# Patient Record
Sex: Male | Born: 1960
Health system: Southern US, Community
[De-identification: ages and names within clinical notes are randomized; demographics above are authoritative.]

## PROBLEM LIST (undated history)

## (undated) DIAGNOSIS — E7800 Pure hypercholesterolemia, unspecified: Secondary | ICD-10-CM

## (undated) DIAGNOSIS — I10 Essential (primary) hypertension: Secondary | ICD-10-CM

## (undated) DIAGNOSIS — E119 Type 2 diabetes mellitus without complications: Secondary | ICD-10-CM

## (undated) DIAGNOSIS — I499 Cardiac arrhythmia, unspecified: Secondary | ICD-10-CM

## (undated) DIAGNOSIS — K5792 Diverticulitis of intestine, part unspecified, without perforation or abscess without bleeding: Secondary | ICD-10-CM

## (undated) HISTORY — PX: NASAL SEPTUM SURGERY: SHX37

## (undated) HISTORY — PX: TONSILLECTOMY: SUR1361

## (undated) HISTORY — PX: ABDOMINAL HERNIA REPAIR: SHX539

## (undated) HISTORY — PX: HERNIA REPAIR: SHX51

---

## 1982-02-09 HISTORY — PX: TOE AMPUTATION: SHX809

## 1985-02-09 HISTORY — PX: TENDON RECONSTRUCTION: SHX2487

## 2010-02-09 HISTORY — PX: HEEL SPUR SURGERY: SHX665

## 2011-02-10 HISTORY — PX: VASECTOMY: SHX75

## 2015-10-29 DIAGNOSIS — Z23 Encounter for immunization: Secondary | ICD-10-CM | POA: Diagnosis not present

## 2015-12-03 ENCOUNTER — Other Ambulatory Visit: Payer: Self-pay | Admitting: Family Medicine

## 2015-12-03 ENCOUNTER — Ambulatory Visit
Admission: RE | Admit: 2015-12-03 | Discharge: 2015-12-03 | Disposition: A | Discharge status: Home / Self Care | Payer: BLUE CROSS/BLUE SHIELD | Source: Ambulatory Visit | Attending: Family Medicine | Admitting: Family Medicine

## 2015-12-03 DIAGNOSIS — R059 Cough, unspecified: Secondary | ICD-10-CM

## 2015-12-03 DIAGNOSIS — E785 Hyperlipidemia, unspecified: Secondary | ICD-10-CM | POA: Diagnosis not present

## 2015-12-03 DIAGNOSIS — Z125 Encounter for screening for malignant neoplasm of prostate: Secondary | ICD-10-CM | POA: Diagnosis not present

## 2015-12-03 DIAGNOSIS — R05 Cough: Secondary | ICD-10-CM | POA: Diagnosis not present

## 2015-12-03 DIAGNOSIS — Z0000 Encounter for general adult medical examination without abnormal findings: Secondary | ICD-10-CM | POA: Diagnosis not present

## 2015-12-03 DIAGNOSIS — E119 Type 2 diabetes mellitus without complications: Secondary | ICD-10-CM | POA: Diagnosis not present

## 2015-12-03 IMAGING — CR DG CHEST 2V
2 series · 2 of 2 positions shown · non-contrast
Comparison: None.

CLINICAL DATA: Chronic cough.

EXAM:
CHEST  2 VIEW

[w chest pa]
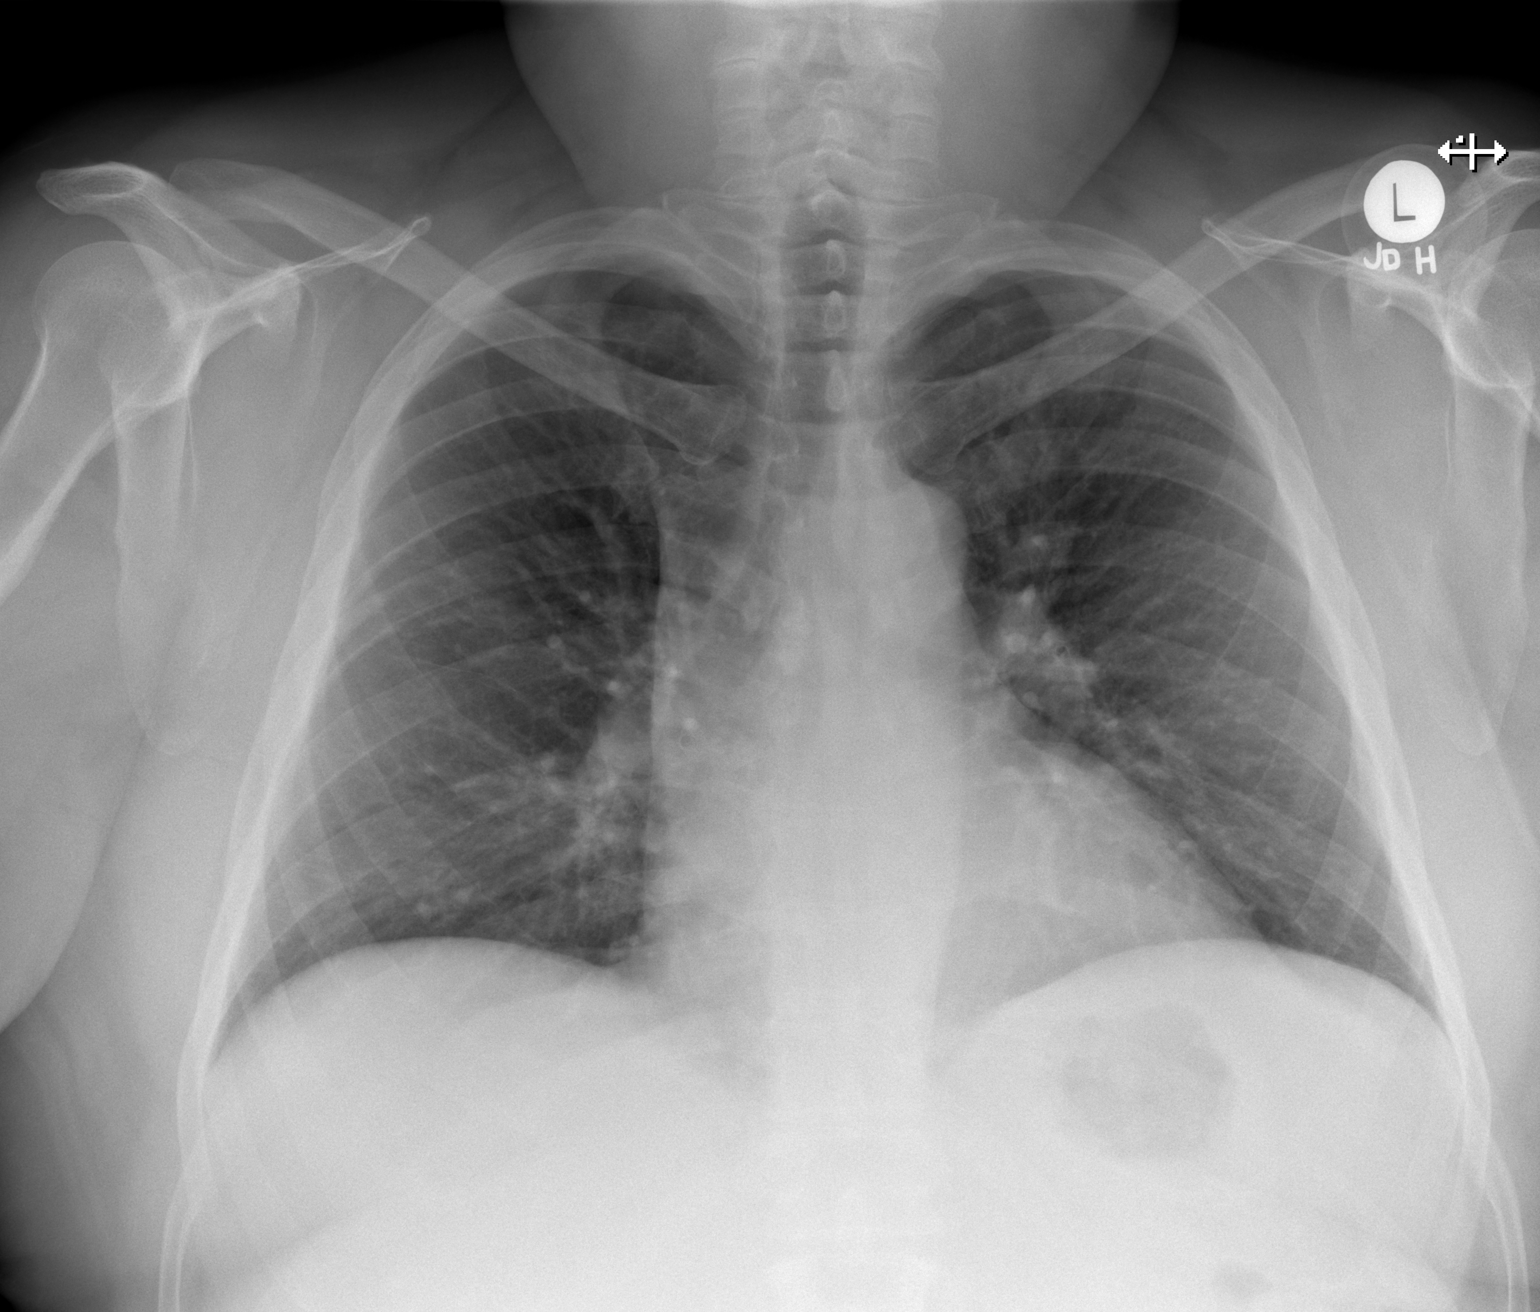

[w chest lat]
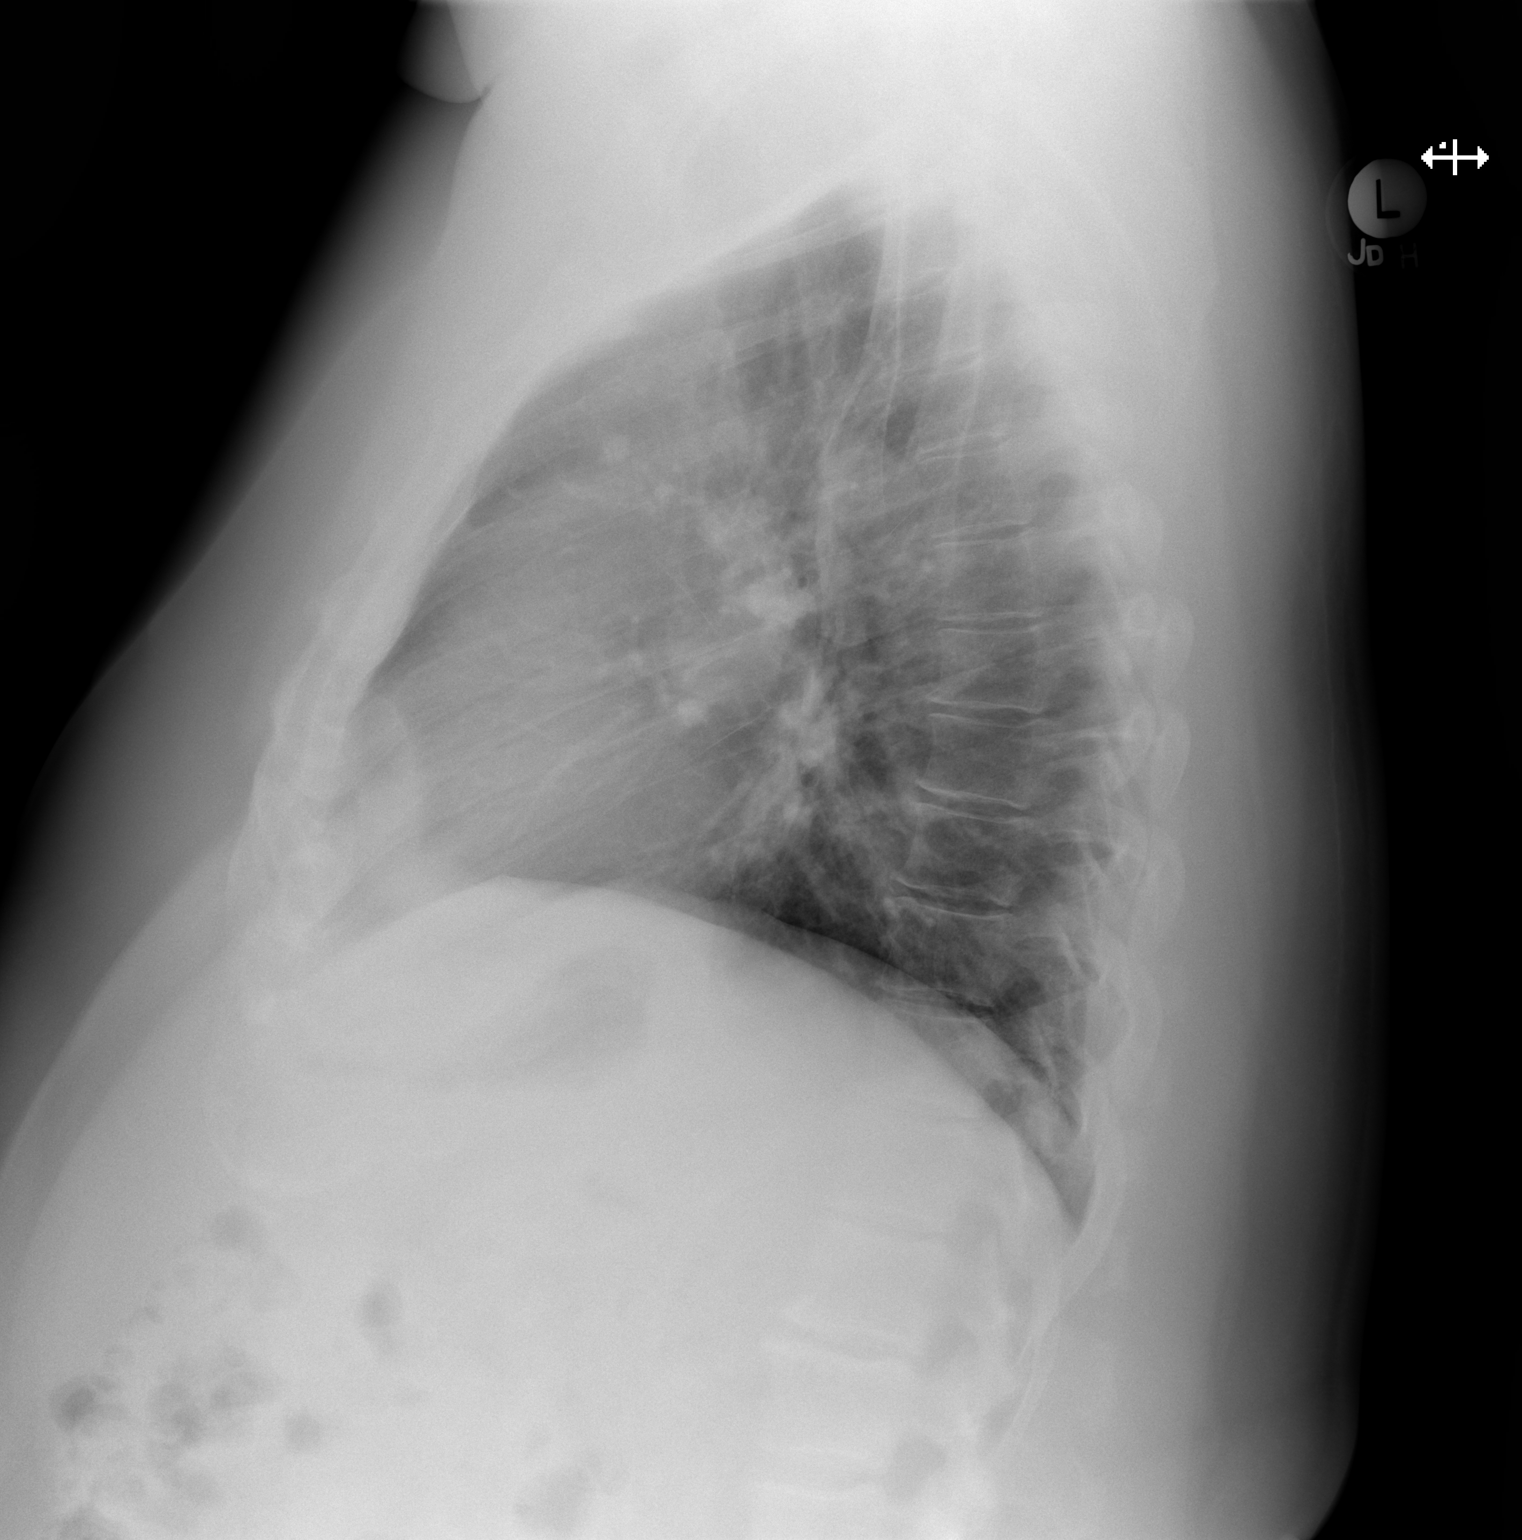

[2 of 2 positions shown; findings below may reference images not displayed]

FINDINGS: The heart size and mediastinal contours are within normal limits.
Both lungs are clear. No pneumothorax or pleural effusion is noted.
The visualized skeletal structures are unremarkable.
IMPRESSION: No active cardiopulmonary disease.

## 2016-07-27 DIAGNOSIS — E119 Type 2 diabetes mellitus without complications: Secondary | ICD-10-CM | POA: Diagnosis not present

## 2016-07-27 DIAGNOSIS — E785 Hyperlipidemia, unspecified: Secondary | ICD-10-CM | POA: Diagnosis not present

## 2016-07-27 DIAGNOSIS — I10 Essential (primary) hypertension: Secondary | ICD-10-CM | POA: Diagnosis not present

## 2016-07-27 DIAGNOSIS — L918 Other hypertrophic disorders of the skin: Secondary | ICD-10-CM | POA: Diagnosis not present

## 2016-11-09 DIAGNOSIS — R52 Pain, unspecified: Secondary | ICD-10-CM | POA: Diagnosis not present

## 2016-11-09 DIAGNOSIS — M94261 Chondromalacia, right knee: Secondary | ICD-10-CM | POA: Diagnosis not present

## 2016-11-13 DIAGNOSIS — R1032 Left lower quadrant pain: Secondary | ICD-10-CM | POA: Diagnosis not present

## 2016-11-24 ENCOUNTER — Other Ambulatory Visit: Payer: Self-pay | Admitting: Family Medicine

## 2016-11-24 ENCOUNTER — Inpatient Hospital Stay (HOSPITAL_COMMUNITY)
Admission: EM | Admit: 2016-11-24 | Discharge: 2016-11-27 | DRG: 392 | Disposition: A | Discharge status: Home / Self Care | Payer: BLUE CROSS/BLUE SHIELD | Attending: Internal Medicine | Admitting: Internal Medicine

## 2016-11-24 ENCOUNTER — Encounter (HOSPITAL_COMMUNITY): Payer: Self-pay | Admitting: *Deleted

## 2016-11-24 ENCOUNTER — Ambulatory Visit
Admission: RE | Admit: 2016-11-24 | Discharge: 2016-11-24 | Disposition: A | Discharge status: Home / Self Care | Payer: BLUE CROSS/BLUE SHIELD | Source: Ambulatory Visit | Attending: Family Medicine | Admitting: Family Medicine

## 2016-11-24 ENCOUNTER — Inpatient Hospital Stay (HOSPITAL_COMMUNITY): Payer: BLUE CROSS/BLUE SHIELD

## 2016-11-24 DIAGNOSIS — Z888 Allergy status to other drugs, medicaments and biological substances status: Secondary | ICD-10-CM

## 2016-11-24 DIAGNOSIS — Z7984 Long term (current) use of oral hypoglycemic drugs: Secondary | ICD-10-CM | POA: Diagnosis not present

## 2016-11-24 DIAGNOSIS — Z87891 Personal history of nicotine dependence: Secondary | ICD-10-CM

## 2016-11-24 DIAGNOSIS — E669 Obesity, unspecified: Secondary | ICD-10-CM | POA: Diagnosis not present

## 2016-11-24 DIAGNOSIS — Z6837 Body mass index (BMI) 37.0-37.9, adult: Secondary | ICD-10-CM | POA: Diagnosis not present

## 2016-11-24 DIAGNOSIS — Z86010 Personal history of colonic polyps: Secondary | ICD-10-CM

## 2016-11-24 DIAGNOSIS — Z7982 Long term (current) use of aspirin: Secondary | ICD-10-CM | POA: Diagnosis not present

## 2016-11-24 DIAGNOSIS — E118 Type 2 diabetes mellitus with unspecified complications: Secondary | ICD-10-CM | POA: Diagnosis present

## 2016-11-24 DIAGNOSIS — I10 Essential (primary) hypertension: Secondary | ICD-10-CM | POA: Diagnosis not present

## 2016-11-24 DIAGNOSIS — Z8249 Family history of ischemic heart disease and other diseases of the circulatory system: Secondary | ICD-10-CM | POA: Diagnosis not present

## 2016-11-24 DIAGNOSIS — K5732 Diverticulitis of large intestine without perforation or abscess without bleeding: Secondary | ICD-10-CM | POA: Diagnosis not present

## 2016-11-24 DIAGNOSIS — D72829 Elevated white blood cell count, unspecified: Secondary | ICD-10-CM | POA: Diagnosis not present

## 2016-11-24 DIAGNOSIS — E785 Hyperlipidemia, unspecified: Secondary | ICD-10-CM

## 2016-11-24 DIAGNOSIS — K5720 Diverticulitis of large intestine with perforation and abscess without bleeding: Secondary | ICD-10-CM

## 2016-11-24 DIAGNOSIS — K5792 Diverticulitis of intestine, part unspecified, without perforation or abscess without bleeding: Secondary | ICD-10-CM

## 2016-11-24 DIAGNOSIS — R1032 Left lower quadrant pain: Secondary | ICD-10-CM

## 2016-11-24 DIAGNOSIS — K5780 Diverticulitis of intestine, part unspecified, with perforation and abscess without bleeding: Secondary | ICD-10-CM | POA: Diagnosis not present

## 2016-11-24 DIAGNOSIS — K630 Abscess of intestine: Secondary | ICD-10-CM | POA: Diagnosis not present

## 2016-11-24 DIAGNOSIS — Z79899 Other long term (current) drug therapy: Secondary | ICD-10-CM | POA: Diagnosis not present

## 2016-11-24 DIAGNOSIS — R109 Unspecified abdominal pain: Secondary | ICD-10-CM | POA: Diagnosis not present

## 2016-11-24 HISTORY — DX: Cardiac arrhythmia, unspecified: I49.9

## 2016-11-24 HISTORY — PX: CT GUIDE ABSCESS DRAINAGE  (ARMC HX): HXRAD1395

## 2016-11-24 HISTORY — DX: Diverticulitis of intestine, part unspecified, without perforation or abscess without bleeding: K57.92

## 2016-11-24 HISTORY — DX: Type 2 diabetes mellitus without complications: E11.9

## 2016-11-24 HISTORY — DX: Pure hypercholesterolemia, unspecified: E78.00

## 2016-11-24 HISTORY — DX: Essential (primary) hypertension: I10

## 2016-11-24 LAB — COMPREHENSIVE METABOLIC PANEL
ALT: 32 U/L (ref 17–63)
ANION GAP: 14 (ref 5–15)
AST: 26 U/L (ref 15–41)
Albumin: 3.5 g/dL (ref 3.5–5.0)
Alkaline Phosphatase: 95 U/L (ref 38–126)
BUN: 8 mg/dL (ref 6–20)
CHLORIDE: 97 mmol/L — AB (ref 101–111)
CO2: 20 mmol/L — AB (ref 22–32)
CREATININE: 0.9 mg/dL (ref 0.61–1.24)
Calcium: 8.9 mg/dL (ref 8.9–10.3)
Glucose, Bld: 134 mg/dL — ABNORMAL HIGH (ref 65–99)
Potassium: 4.4 mmol/L (ref 3.5–5.1)
SODIUM: 131 mmol/L — AB (ref 135–145)
Total Bilirubin: 0.5 mg/dL (ref 0.3–1.2)
Total Protein: 7.8 g/dL (ref 6.5–8.1)

## 2016-11-24 LAB — URINALYSIS, ROUTINE W REFLEX MICROSCOPIC
BILIRUBIN URINE: NEGATIVE
Bacteria, UA: NONE SEEN
GLUCOSE, UA: NEGATIVE mg/dL
Ketones, ur: 20 mg/dL — AB
Leukocytes, UA: NEGATIVE
NITRITE: NEGATIVE
PROTEIN: NEGATIVE mg/dL
SPECIFIC GRAVITY, URINE: 1.044 — AB (ref 1.005–1.030)
Squamous Epithelial / LPF: NONE SEEN
pH: 6 (ref 5.0–8.0)

## 2016-11-24 LAB — CBC
HEMATOCRIT: 47.2 % (ref 39.0–52.0)
Hemoglobin: 15.4 g/dL (ref 13.0–17.0)
MCH: 29.4 pg (ref 26.0–34.0)
MCHC: 32.6 g/dL (ref 30.0–36.0)
MCV: 90.1 fL (ref 78.0–100.0)
PLATELETS: 391 10*3/uL (ref 150–400)
RBC: 5.24 MIL/uL (ref 4.22–5.81)
RDW: 13.3 % (ref 11.5–15.5)
WBC: 17.1 10*3/uL — ABNORMAL HIGH (ref 4.0–10.5)

## 2016-11-24 LAB — PROTIME-INR
INR: 1.06
Prothrombin Time: 13.7 seconds (ref 11.4–15.2)

## 2016-11-24 LAB — I-STAT CG4 LACTIC ACID, ED: LACTIC ACID, VENOUS: 1.39 mmol/L (ref 0.5–1.9)

## 2016-11-24 IMAGING — CT CT IMAGE GUIDED DRAINAGE BY PERCUTANEOUS CATHETER
1 of 2 series · 15 of 32 positions shown, 19 images · non-contrast
Comparison: CT of the abdomen and pelvis performed earlier today.

CLINICAL DATA: Large left lower quadrant sigmoid diverticular
peritoneal abscess requiring drainage.

EXAM:
CT GUIDED CATHETER DRAINAGE OF PERITONEAL ABSCESS

[Series 2: i-spiral 5.0 b40f · axial · 0.93mm/px · z∈[+783,+972]mm · 15 of 62 slices shown, 19 images]
[im 5/62  soft-tissue]
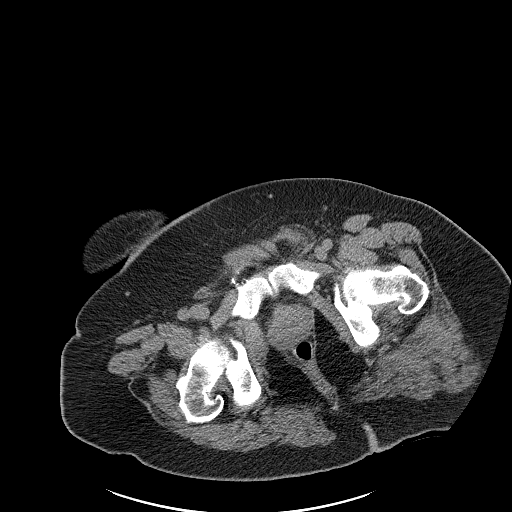
[im 5/62  bone]
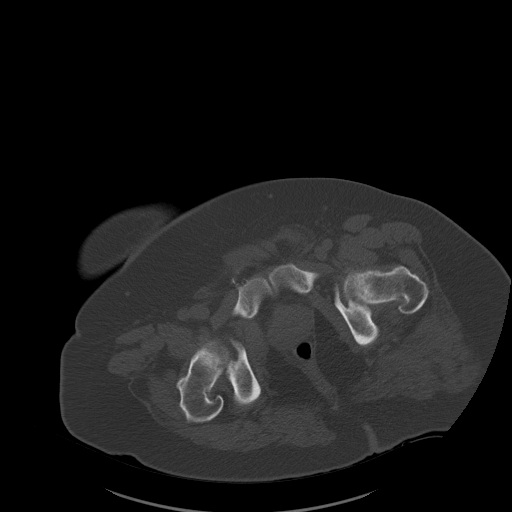
[im 10/62  soft-tissue]
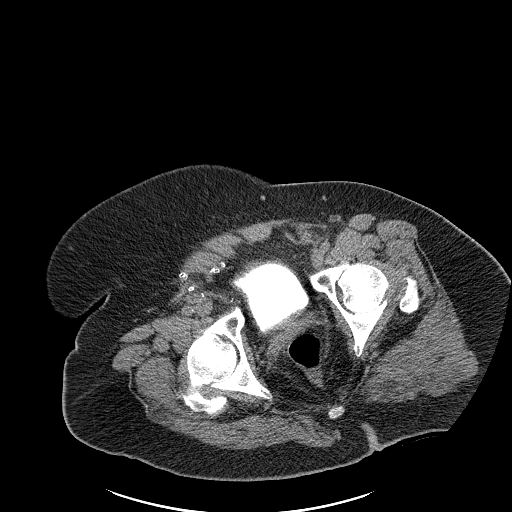
[im 14/62  soft-tissue]
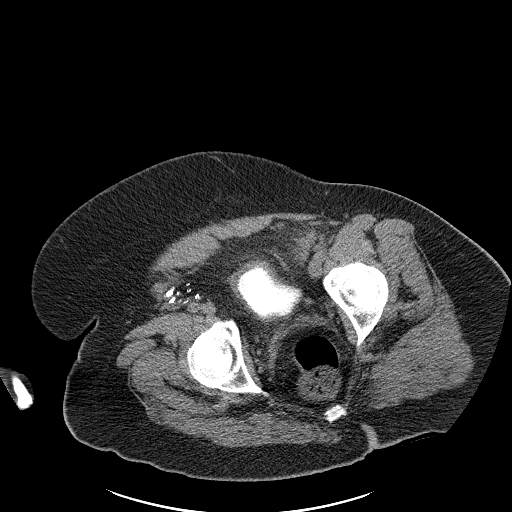
[im 19/62  soft-tissue]
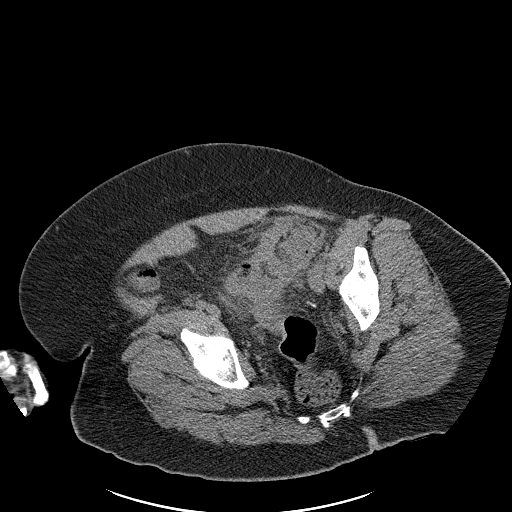
[im 23/62  soft-tissue]
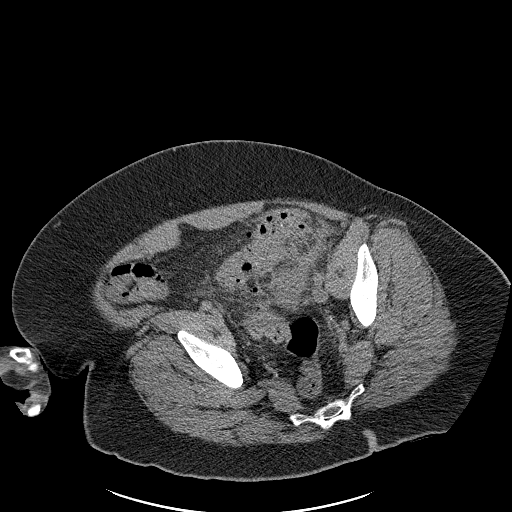
[im 28/62  soft-tissue]
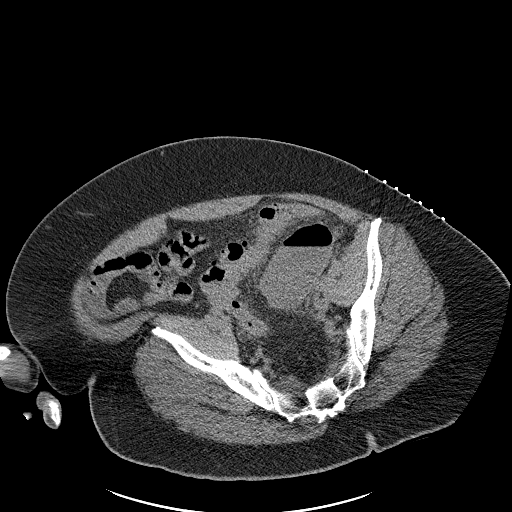
[im 32/62  soft-tissue]
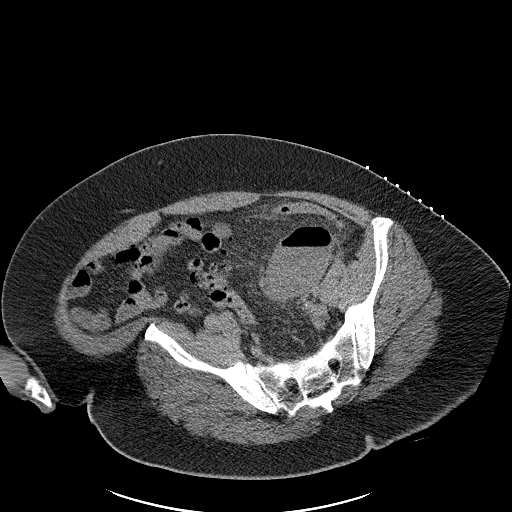
[im 37/62  soft-tissue]
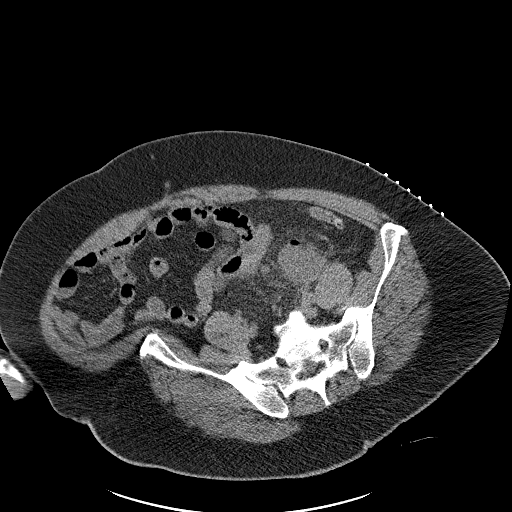
[im 41/62  soft-tissue]
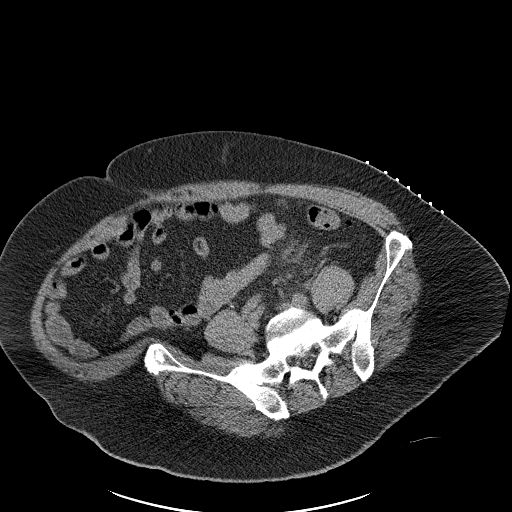
[im 41/62  bone]
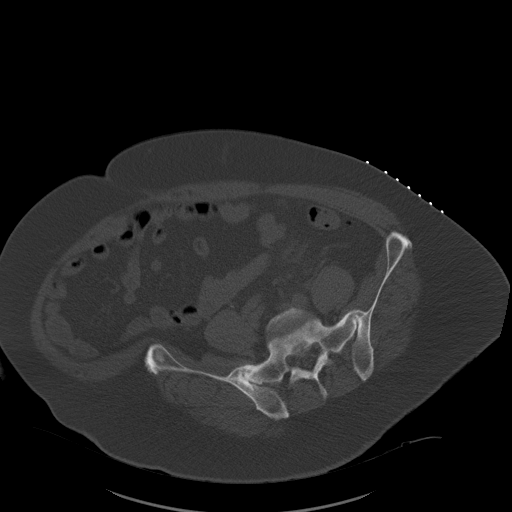
[im 46/62  soft-tissue]
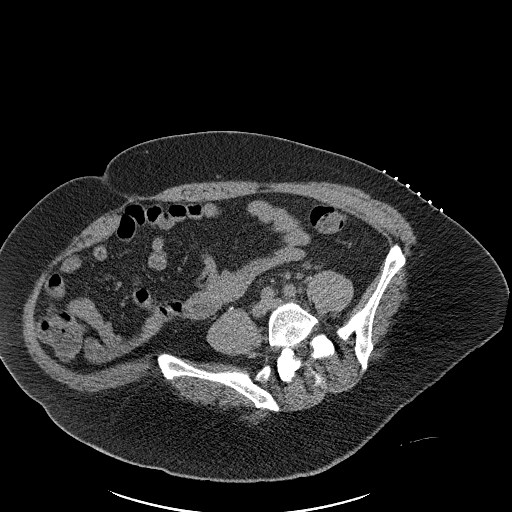
[im 50/62  soft-tissue]
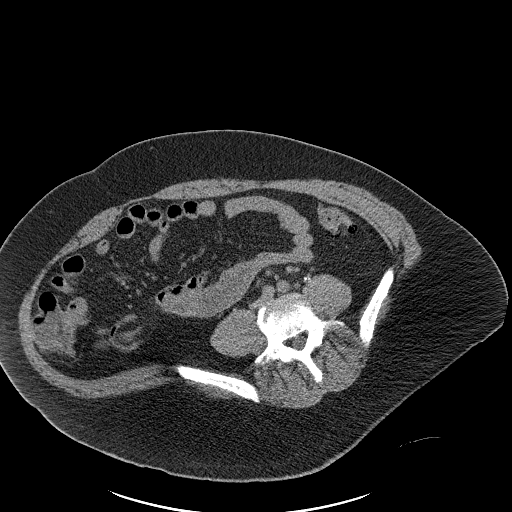
[im 52/62  lung]
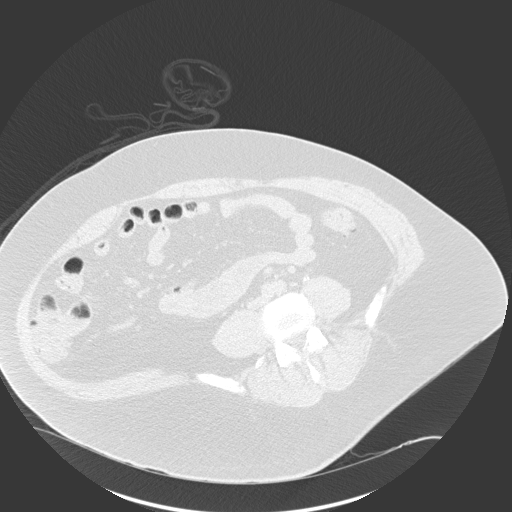
[im 55/62  soft-tissue]
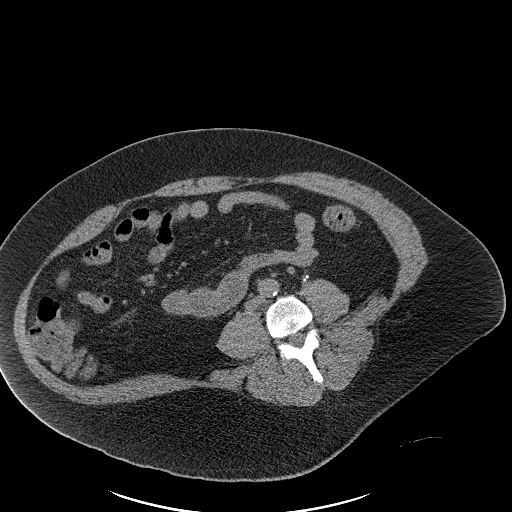
[im 55/62  lung]
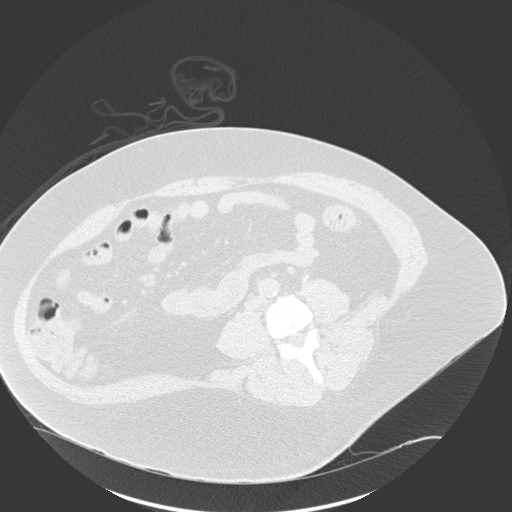
[im 57/62  lung]
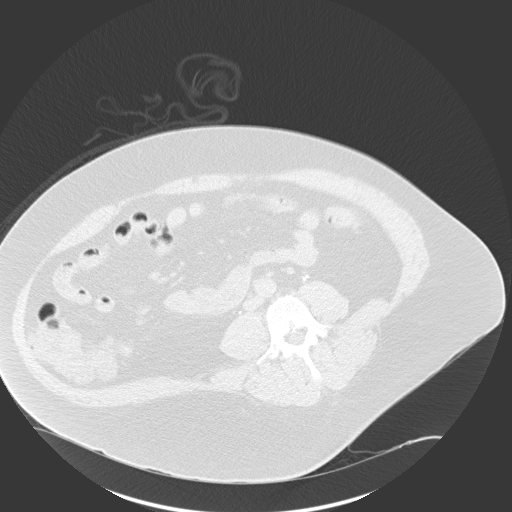
[im 59/62  soft-tissue]
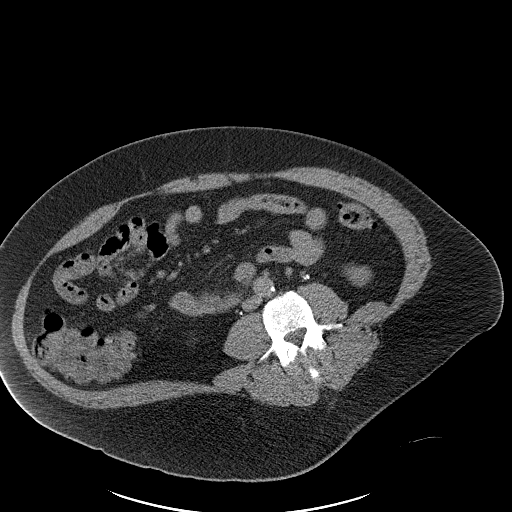
[im 59/62  lung]
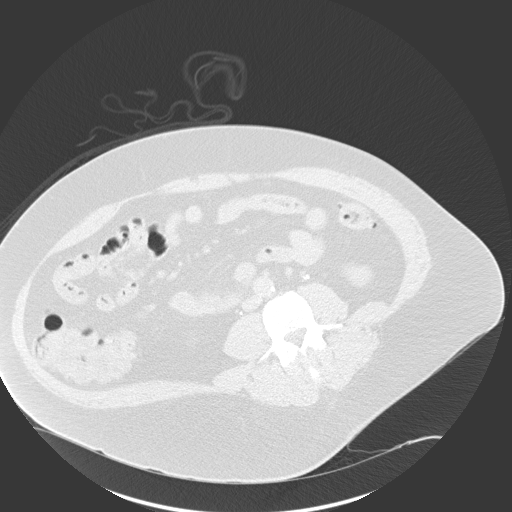

[15 of 32 positions shown; findings below may reference images not displayed]

ANESTHESIA/SEDATION:
2.0 mg IV Versed 100 mcg IV Fentanyl

Total Moderate Sedation Time:  18 minutes

The patient's level of consciousness and physiologic status were
continuously monitored during the procedure by Radiology nursing.

PROCEDURE:
The procedure, risks, benefits, and alternatives were explained to
the patient. Questions regarding the procedure were encouraged and
answered. The patient understands and consents to the procedure. A
time out was performed prior to initiating the procedure.

The left lower abdominal wall was prepped with chlorhexidine in a
sterile fashion, and a sterile drape was applied covering the
operative field. A sterile gown and sterile gloves were used for the
procedure. Local anesthesia was provided with 1% Lidocaine.

CT was performed in a supine position with the left side rolled up
slightly. Under CT guidance, an 18 gauge trocar needle was advanced
into a left lower quadrant diverticular abscess. A fluid sample was
aspirated and sent for culture analysis. A guidewire was advanced
into the collection. The percutaneous tract was dilated and a 12
French percutaneous drainage catheter advanced over the wire.
Catheter positioning was confirmed by CT. The catheter was flushed
and connected to a suction bulb. It was secured at the skin with a
Prolene retention suture and StatLock device.

COMPLICATIONS:
None
FINDINGS: Large left lower quadrant diverticular abscess is identified
superior and posterior to the sigmoid colon. Aspiration yielded
grossly purulent greenish white liquid. A 12 French drain was placed
and is draining well after placement.
IMPRESSION: CT-guided percutaneous catheter drainage of sigmoid diverticular
abscess. A 12 French drainage catheter was placed and attached to
suction bulb drainage. Drain output will be followed. A fluid sample
was sent for culture analysis.

## 2016-11-24 IMAGING — CT CT ABD-PELV W/ CM
1 of 3 series · 13 of 32 positions shown, 18 images · IV contrast (APPLIED)
Comparison: None.

ADDENDUM:
Salient findings discussed by telephone with Dr. YEDOTI TIGEST on
11/24/2016 at 7174 hours.
CLINICAL DATA: 56-year-old male with persistent left lower quadrant
abdominal pain, low-grade fever, nausea vomiting. Diagnosed with
diverticulitis and started on antibiotics 11/13/2016, but symptoms
persist.

EXAM:
CT ABDOMEN AND PELVIS WITH CONTRAST
TECHNIQUE: Multidetector CT imaging of the abdomen and pelvis was performed
using the standard protocol following bolus administration of
intravenous contrast.
CONTRAST:  125mL ZAWER5-TTT IOPAMIDOL (ZAWER5-TTT) INJECTION 61%

[Series 2: abd/pelvis w/cm · axial · 0.98mm/px · z∈[-468,-8]mm · 13 of 104 slices shown, 18 images]
[im 6/104  soft-tissue]
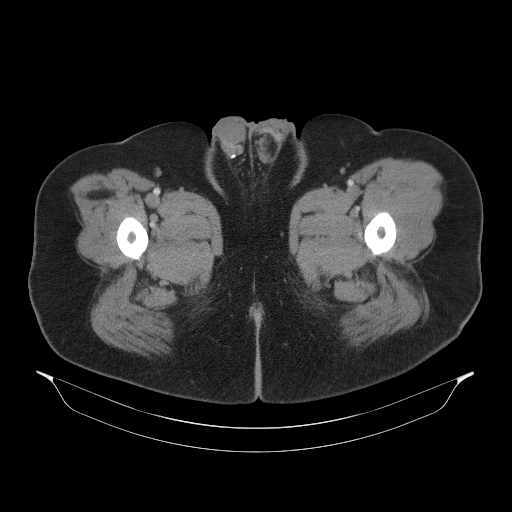
[im 6/104  bone]
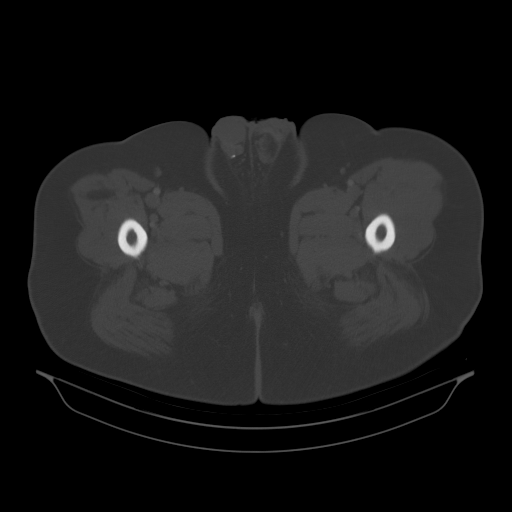
[im 18/104  soft-tissue]
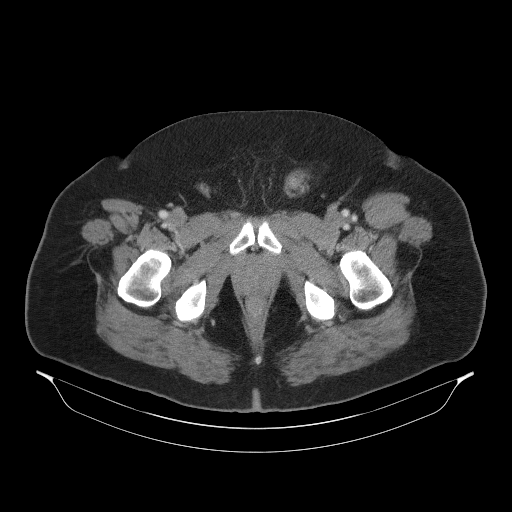
[im 23/104  soft-tissue]
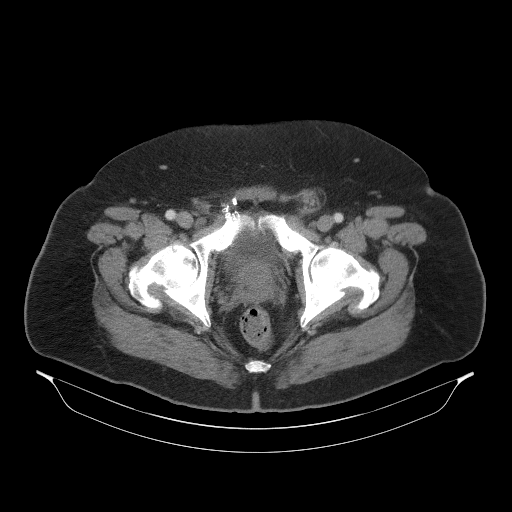
[im 29/104  soft-tissue]
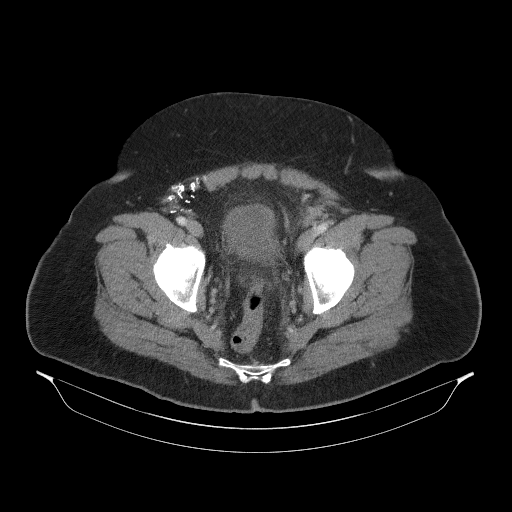
[im 41/104  soft-tissue]
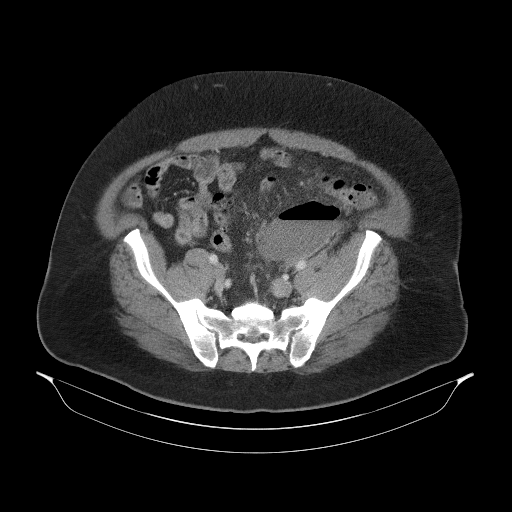
[im 46/104  soft-tissue]
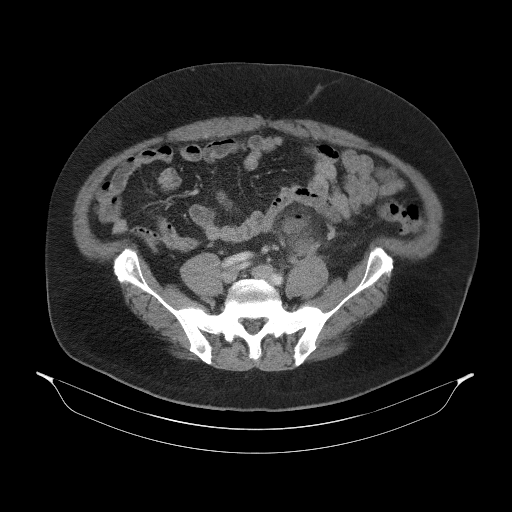
[im 58/104  soft-tissue]
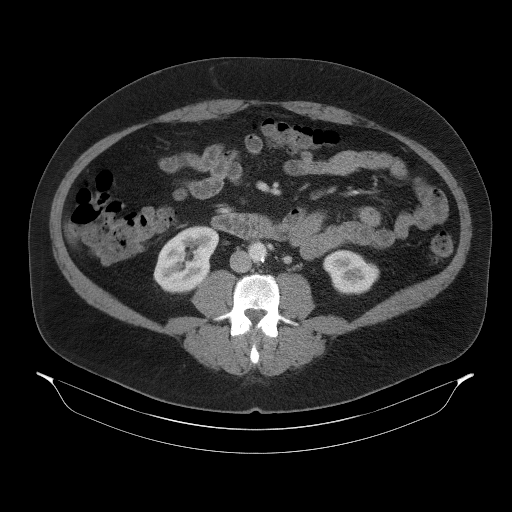
[im 63/104  soft-tissue]
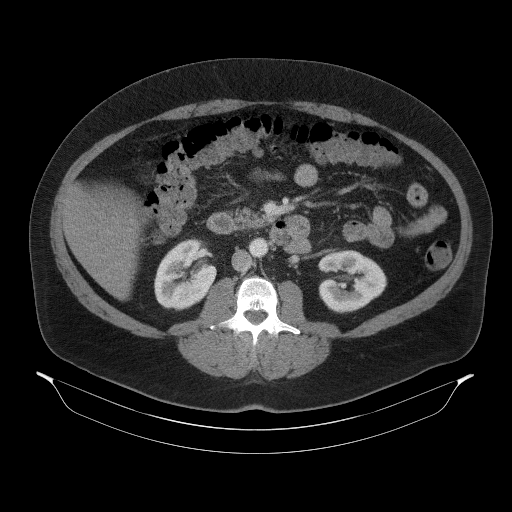
[im 75/104  soft-tissue]
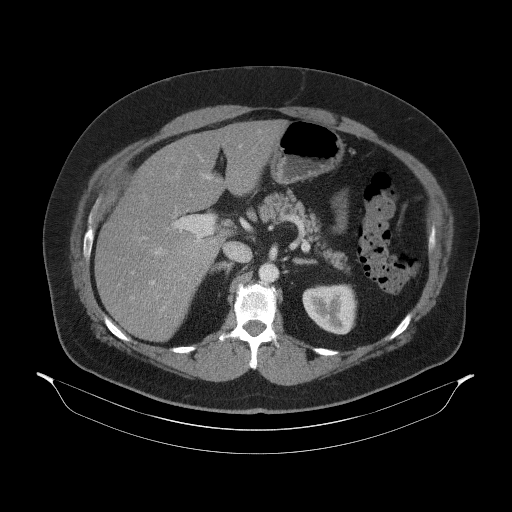
[im 75/104  bone]
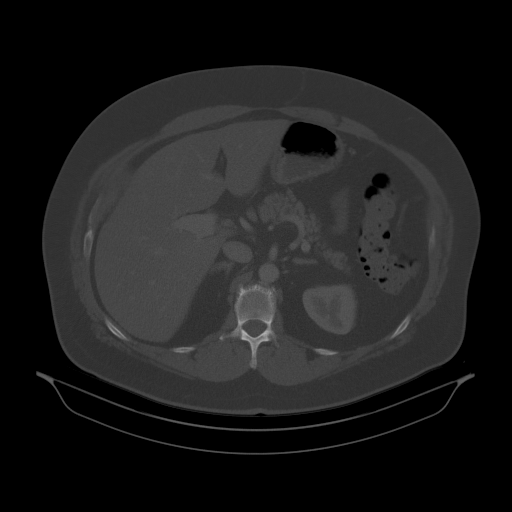
[im 81/104  soft-tissue]
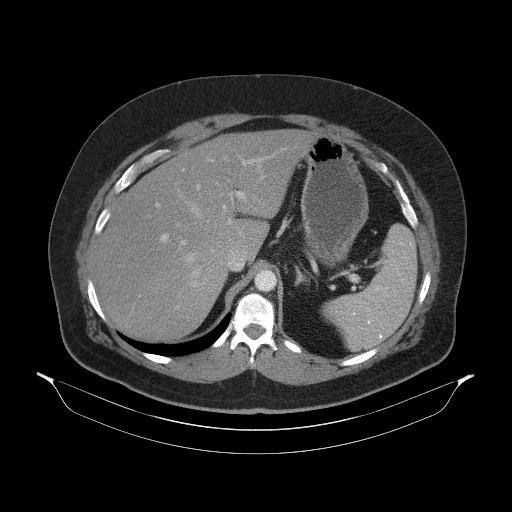
[im 81/104  lung]
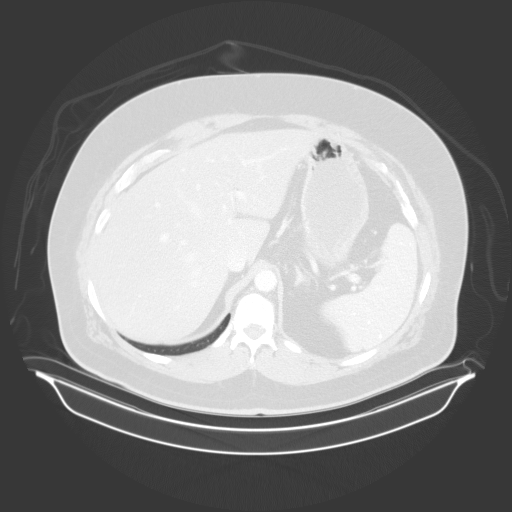
[im 86/104  soft-tissue]
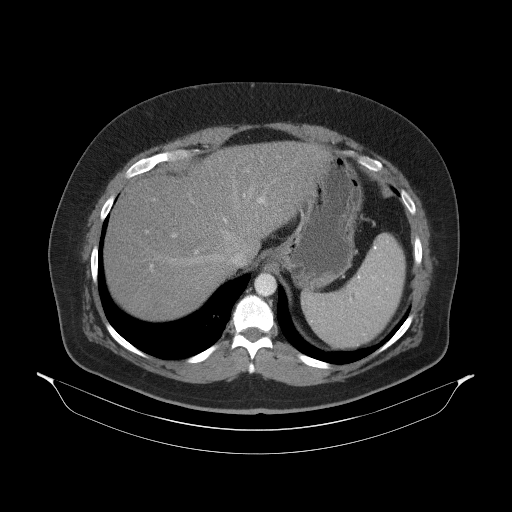
[im 86/104  lung]
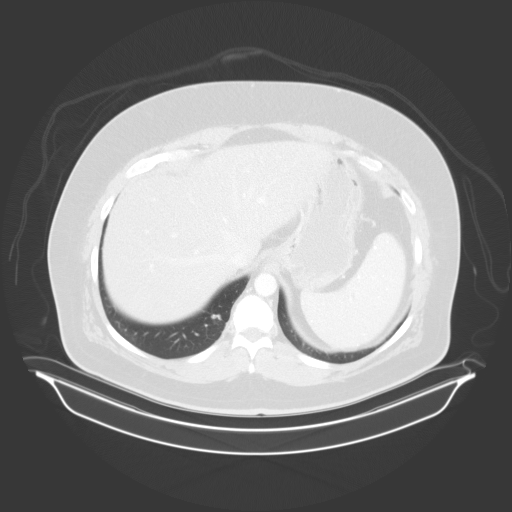
[im 92/104  lung]
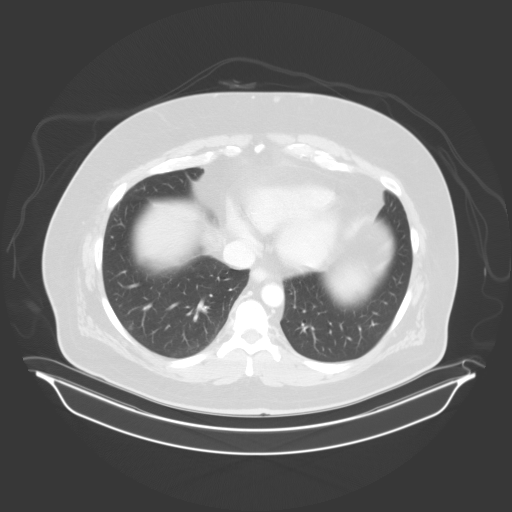
[im 98/104  soft-tissue]
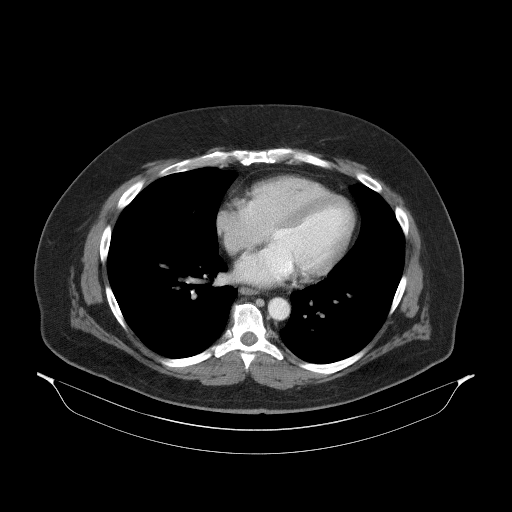
[im 98/104  lung]
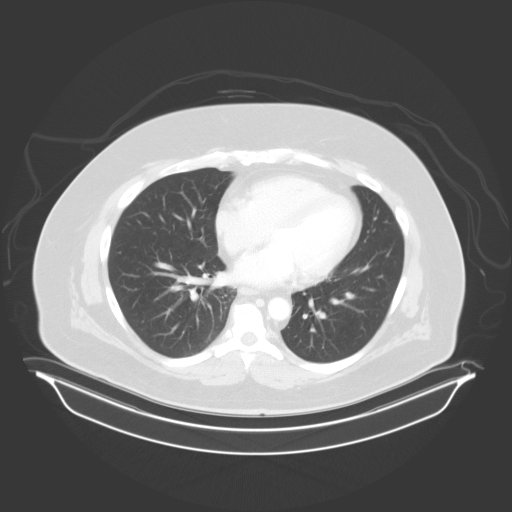

[13 of 32 positions shown; findings below may reference images not displayed]

FINDINGS: Lower chest: Negative lung bases. No pericardial or pleural
effusion. Small calcified right hilar lymph nodes compatible with
granulomatous disease.

Hepatobiliary: Decreased density in keeping with hepatic steatosis.
Negative gallbladder. No biliary ductal enlargement.

Pancreas: Negative.

Spleen: Negative; multiple punctate calcified granulomas.

Adrenals/Urinary Tract: Possible small right adrenal gland
myelolipoma (series 2, image 26, benign), with otherwise normal
adrenal glands. Bilateral renal enhancement and contrast excretion
is normal. The left renal collecting system and left ureter are
duplicated. The left ureters are nondilated despite extension
through the left retroperitoneal inflammation.

Unremarkable urinary bladder.

Stomach/Bowel: Negative rectum.

Moderate to severe inflammation surrounding the sigmoid colon over a
segment of about 12 cm with numerous diverticula in the region.
Sigmoid wall thickening up to 2 cm. Along the superior and posterior
aspect of the sigmoid segment there is a large oval gas and fluid
containing collection encompassing 64 x 86 x 63 mm (AP by transverse
by CC) with an estimated volume of 173 mL. See series 2, image 65,
sagittal image 90. Inflammation surrounding this structure in
tracking along the left pelvic side wall.

No other extraluminal gas or fluid identified. Diverticulosis
continues throughout the more proximal large bowel but with no other
active inflammation identified. Normal appendix. Negative terminal
ileum. No dilated small bowel. Negative stomach and duodenum.

Vascular/Lymphatic: Major arterial structures in the abdomen and
pelvis are patent. Portal venous system is patent. New line reactive
appearing left retroperitoneal and mesenteric lymph nodes about the
abnormal sigmoid colon.

Reproductive: Evidence of prior vasectomy.

Other: Evidence of prior right inguinal hernia repair.

No pelvic free fluid.

Musculoskeletal: Negative.
IMPRESSION: 1. Complicated Sigmoid Diverticulitis with an 8.6 cm Pericolic
Abscess in the left lower abdomen.
Recommend hospital admission and consultation of both Surgery and
Interventional Radiology for treatment planning and drainage.
2. Reactive appearing abdominal lymph nodes. No other complicating
features identified.
3. Hepatic steatosis.
4. Incidental duplicated left renal collecting system and ureter.
5. Incidental old granulomatous disease.

## 2016-11-24 MED ORDER — ACETAMINOPHEN 650 MG RE SUPP: 650.0000 mg | Freq: Four times a day (QID) | RECTAL | Status: DC | PRN

## 2016-11-24 MED ORDER — FENTANYL CITRATE (PF) 100 MCG/2ML IJ SOLN
INTRAMUSCULAR | Status: AC
  Filled 2016-11-24: qty 2

## 2016-11-24 MED ORDER — LIDOCAINE HCL 1 % IJ SOLN
INTRAMUSCULAR | Status: AC
  Filled 2016-11-24: qty 20

## 2016-11-24 MED ORDER — HYDRALAZINE HCL 20 MG/ML IJ SOLN: 5.0000 mg | INTRAMUSCULAR | Status: DC | PRN

## 2016-11-24 MED ORDER — SODIUM CHLORIDE 0.9 % IV SOLN
INTRAVENOUS | Status: DC
  Administered 2016-11-24 – 2016-11-25 (×2): via INTRAVENOUS

## 2016-11-24 MED ORDER — SODIUM CHLORIDE 0.9 % IV SOLN
INTRAVENOUS | Status: AC | PRN
  Administered 2016-11-24: 10 mL/h via INTRAVENOUS

## 2016-11-24 MED ORDER — ACETAMINOPHEN 325 MG PO TABS: 650.0000 mg | ORAL_TABLET | Freq: Four times a day (QID) | ORAL | Status: DC | PRN

## 2016-11-24 MED ORDER — MORPHINE SULFATE (PF) 4 MG/ML IV SOLN
4.0000 mg | INTRAVENOUS | Status: DC | PRN
  Administered 2016-11-24 – 2016-11-26 (×5): 4 mg via INTRAVENOUS
  Filled 2016-11-24 (×7): qty 1

## 2016-11-24 MED ORDER — IOPAMIDOL (ISOVUE-300) INJECTION 61%
125.0000 mL | Freq: Once | INTRAVENOUS | Status: AC | PRN
Start: 2016-11-24 — End: 2016-11-24
  Administered 2016-11-24: 125 mL via INTRAVENOUS

## 2016-11-24 MED ORDER — MIDAZOLAM HCL 2 MG/2ML IJ SOLN
INTRAMUSCULAR | Status: AC
  Filled 2016-11-24: qty 2

## 2016-11-24 MED ORDER — ASPIRIN 81 MG PO CHEW
81.0000 mg | CHEWABLE_TABLET | Freq: Every day | ORAL | Status: DC
  Administered 2016-11-25 – 2016-11-27 (×3): 81 mg via ORAL
  Filled 2016-11-24 (×4): qty 1

## 2016-11-24 MED ORDER — ONDANSETRON HCL 4 MG PO TABS: 4.0000 mg | ORAL_TABLET | Freq: Four times a day (QID) | ORAL | Status: DC | PRN

## 2016-11-24 MED ORDER — ONDANSETRON HCL 4 MG/2ML IJ SOLN: 4.0000 mg | Freq: Four times a day (QID) | INTRAMUSCULAR | Status: DC | PRN

## 2016-11-24 MED ORDER — SODIUM CHLORIDE 0.9% FLUSH
10.0000 mL | Freq: Three times a day (TID) | INTRAVENOUS | Status: DC
  Administered 2016-11-25 – 2016-11-27 (×3): 10 mL

## 2016-11-24 MED ORDER — ATORVASTATIN CALCIUM 20 MG PO TABS
20.0000 mg | ORAL_TABLET | Freq: Every day | ORAL | Status: DC
  Administered 2016-11-24 – 2016-11-26 (×3): 20 mg via ORAL
  Filled 2016-11-24 (×3): qty 1

## 2016-11-24 MED ORDER — KETOROLAC TROMETHAMINE 30 MG/ML IJ SOLN
30.0000 mg | Freq: Four times a day (QID) | INTRAMUSCULAR | Status: DC | PRN
  Administered 2016-11-24 – 2016-11-27 (×5): 30 mg via INTRAVENOUS
  Filled 2016-11-24 (×5): qty 1

## 2016-11-24 MED ORDER — PIPERACILLIN-TAZOBACTAM 3.375 G IVPB
3.3750 g | Freq: Three times a day (TID) | INTRAVENOUS | Status: DC
  Administered 2016-11-24 – 2016-11-27 (×8): 3.375 g via INTRAVENOUS
  Filled 2016-11-24 (×10): qty 50

## 2016-11-24 MED ORDER — FENTANYL CITRATE (PF) 100 MCG/2ML IJ SOLN
INTRAMUSCULAR | Status: AC | PRN
  Administered 2016-11-24 (×2): 50 ug via INTRAVENOUS

## 2016-11-24 MED ORDER — METOPROLOL SUCCINATE ER 50 MG PO TB24
50.0000 mg | ORAL_TABLET | Freq: Every day | ORAL | Status: DC
  Administered 2016-11-24 – 2016-11-26 (×3): 50 mg via ORAL
  Filled 2016-11-24 (×3): qty 1

## 2016-11-24 MED ORDER — PIPERACILLIN-TAZOBACTAM 3.375 G IVPB 30 MIN
3.3750 g | Freq: Once | INTRAVENOUS | Status: AC
  Administered 2016-11-24: 3.375 g via INTRAVENOUS
  Filled 2016-11-24: qty 50

## 2016-11-24 MED ORDER — SODIUM CHLORIDE 0.9 % IV SOLN
Freq: Once | INTRAVENOUS | Status: AC
  Administered 2016-11-24: 14:00:00 via INTRAVENOUS

## 2016-11-24 MED ORDER — MIDAZOLAM HCL 2 MG/2ML IJ SOLN
INTRAMUSCULAR | Status: AC | PRN
  Administered 2016-11-24 (×2): 1 mg via INTRAVENOUS

## 2016-11-24 NOTE — ED Triage Notes (Addendum)
Sent to ED from [facility] after have abd ct scan. Pt has hx of diverticulitis. Called and told to come to ED due to finding an abscess in colon with 'a large amout of pus'. States he has had fevers for the past 2 wks. Nauseated. No vomiting. Pt just finished a 10 day dose of abx for diverticulitis

## 2016-11-24 NOTE — Sedation Documentation (Signed)
Patient is resting comfortably. 

## 2016-11-24 NOTE — ED Notes (Signed)
Patient transported to CT 

## 2016-11-24 NOTE — Consult Note (Signed)
[facility] Surgery Consult Note  Matthew Reid 03-Sep-1960  [id number].    Requesting MD: Jeneen Rinks, MD Chief Complaint/Reason for Consult: diverticulitis with abscess  HPI:  Matthew Reid is a 56 y/o male with a PMH HTN, DM, HLD, and diverticulitis who presented to [facility] with a cc of recurrent diverticulitis. He states that earlier this month around 10/4 he was seen by his PCP for pain that felt similar to previous episodes of diverticulitis. Pain described as dull suprapubic pain/pressing that progressed to stabbing LLQ pain over 24 hours. His PCP recommented CT scan of the abdomen at this time but he wanted to attempt treatment with antibiotics and avoid a scan. He just finished a 10 day course of cipro/flagyl and his symptoms improved but he is still having intermittent LLQ/suprapubic abdominal discomfort, nausea, intermittent loose stools, chills/night sweats, and poor oral intake. His PCP ordered a CT scan significant for sigmoid diverticulitis with 6.8 cm pericolonic abscess and the patient was advised to come to the emergency room.   He reports his first episode of diverticulitis was roughly 12 years ago and resulted in a 3 day hospitalization for IV abx. Since that time he has had 5-6 episodes of diverticulitis treated with PO abx. He has never been referred to a Psychologist, sport and exercise. He receives colonoscopies every 5 years and states that his first colonoscopy performed after initial diverticulitis attack showed diverticula and 2 benign polyps. Past abdominal surgeries include laparoscopic hernia repair w mesh in 1997. NKDA. Denies use of blood thinning medications. Reports that he quit smoking many years ago but has been chewing nicotine gum for roughly 6 years. Denies regular alcohol use. Reports smoking marijuana a few times a year. No other drug use. Denies PMH CVA or MI.   ED workup: afebrile, VSS, WBC 17.1, CT scan as above.  ROS: Review of Systems  Constitutional: Positive for fever.   Gastrointestinal: Positive for abdominal pain and nausea.  All other systems reviewed and are negative.   No family history on file.  Past Medical History:  Diagnosis Date  . Diabetes mellitus without complication ([place])   . Diverticulitis   . Hypertension     Past Surgical History:  Procedure Laterality Date  . HERNIA REPAIR    . JOINT REPLACEMENT    . TONSILLECTOMY      Social History:  has no tobacco, alcohol, and drug history on file.  Allergies: No Known Allergies   (Not in a hospital admission)  Blood pressure 139/80, pulse 95, temperature 98 F (36.7 C), temperature source Oral, resp. rate (!) 22, height 5' 7"  (1.702 m), weight 108.9 kg (240 lb), SpO2 94 %. Physical Exam: Physical Exam  Constitutional: He is oriented to person, place, and time. He appears well-developed and well-nourished. No distress.  HENT:  Head: Normocephalic and atraumatic.  Right Ear: External ear normal.  Left Ear: External ear normal.  Mouth/Throat: Oropharynx is clear and moist.  Eyes: Pupils are equal, round, and reactive to light. EOM are normal. Right eye exhibits no discharge. Left eye exhibits no discharge. No scleral icterus.  Neck: Normal range of motion. Neck supple. No tracheal deviation present. No thyromegaly present.  Cardiovascular: Normal rate, regular rhythm, normal heart sounds and intact distal pulses.  Exam reveals no friction rub.   No murmur heard. Pulmonary/Chest: Effort normal and breath sounds normal. No stridor. No respiratory distress. He has no wheezes. He has no rales.  Abdominal: Soft. He exhibits no distension and no mass. There is tenderness. There  is no rebound and no guarding. No hernia.  Hypoactive bowel sounds  Musculoskeletal: Normal range of motion. He exhibits no edema or tenderness.  L great toe amputation many years ago from [employer].  Neurological: He is alert and oriented to person, place, and time. No sensory deficit.  Skin: Skin is warm and  dry. No rash noted. He is not diaphoretic.  Psychiatric: He has a normal mood and affect. His behavior is normal.    Results for orders placed or performed during the hospital encounter of 11/24/16 (from the past 48 hour(s))  Comprehensive metabolic panel     Status: Abnormal   Collection Time: 11/24/16 12:56 PM  Result Value Ref Range   Sodium 131 (L) 135 - 145 mmol/L   Potassium 4.4 3.5 - 5.1 mmol/L   Chloride 97 (L) 101 - 111 mmol/L   CO2 20 (L) 22 - 32 mmol/L   Glucose, Bld 134 (H) 65 - 99 mg/dL   BUN 8 6 - 20 mg/dL   Creatinine, Ser 0.90 0.61 - 1.24 mg/dL   Calcium 8.9 8.9 - 10.3 mg/dL   Total Protein 7.8 6.5 - 8.1 g/dL   Albumin 3.5 3.5 - 5.0 g/dL   AST 26 15 - 41 U/L   ALT 32 17 - 63 U/L   Alkaline Phosphatase 95 38 - 126 U/L   Total Bilirubin 0.5 0.3 - 1.2 mg/dL   GFR calc non Af Amer >60 >60 mL/min   GFR calc Af Amer >60 >60 mL/min    Comment: (NOTE) The eGFR has been calculated using the CKD EPI equation. This calculation has not been validated in all clinical situations. eGFR's persistently <60 mL/min signify possible Chronic Kidney Disease.    Anion gap 14 5 - 15  CBC     Status: Abnormal   Collection Time: 11/24/16  1:30 PM  Result Value Ref Range   WBC 17.1 (H) 4.0 - 10.5 K/uL   RBC 5.24 4.22 - 5.81 MIL/uL   Hemoglobin 15.4 13.0 - 17.0 g/dL   HCT 47.2 39.0 - 52.0 %   MCV 90.1 78.0 - 100.0 fL   MCH 29.4 26.0 - 34.0 pg   MCHC 32.6 30.0 - 36.0 g/dL   RDW 13.3 11.5 - 15.5 %   Platelets 391 150 - 400 K/uL   Ct Abdomen Pelvis W Contrast  Addendum Date: 11/24/2016   ADDENDUM REPORT: 11/24/2016 12:33 ADDENDUM: Salient findings discussed by telephone with Dr. Jonathon Jordan on 11/24/2016 at 1219 hours. Electronically Signed   By: Genevie Ann M.D.   On: 11/24/2016 12:33   Result Date: 11/24/2016 CLINICAL DATA:  56 year old male with persistent left lower quadrant abdominal pain, low-grade fever, nausea vomiting. Diagnosed with diverticulitis and started on  antibiotics 11/13/2016, but symptoms persist. EXAM: CT ABDOMEN AND PELVIS WITH CONTRAST TECHNIQUE: Multidetector CT imaging of the abdomen and pelvis was performed using the standard protocol following bolus administration of intravenous contrast. CONTRAST:  143m ISOVUE-300 IOPAMIDOL (ISOVUE-300) INJECTION 61% COMPARISON:  None. FINDINGS: Lower chest: Negative lung bases. No pericardial or pleural effusion. Small calcified right hilar lymph nodes compatible with granulomatous disease. Hepatobiliary: Decreased density in keeping with hepatic steatosis. Negative gallbladder. No biliary ductal enlargement. Pancreas: Negative. Spleen: Negative; multiple punctate calcified granulomas. Adrenals/Urinary Tract: Possible small right adrenal gland myelolipoma (series 2, image 26, benign), with otherwise normal adrenal glands. Bilateral renal enhancement and contrast excretion is normal. 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 The left ureters are nondilated despite  extension through the left retroperitoneal inflammation. Unremarkable urinary bladder. Stomach/Bowel: Negative rectum. Moderate to severe inflammation surrounding the sigmoid colon over a segment of about 12 cm with numerous diverticula in the region. Sigmoid wall thickening up to 2 cm. Along the superior and posterior aspect of the sigmoid segment there is a large oval gas and fluid containing collection encompassing 64 x 86 x 63 mm (AP by transverse by CC) with an estimated volume of 173 mL. See series 2, image 65, sagittal image 90. Inflammation surrounding this structure in tracking along the left pelvic side wall. No other extraluminal gas or fluid identified. Diverticulosis continues throughout the more proximal large bowel but with no other active inflammation identified. Normal appendix. Negative terminal ileum. No dilated small bowel. Negative stomach and duodenum. Vascular/Lymphatic: Major arterial structures in the abdomen and  pelvis are patent. Portal venous system is patent. New line reactive appearing left retroperitoneal and mesenteric lymph nodes about the abnormal sigmoid colon. Reproductive: Evidence of prior vasectomy. Other: Evidence of prior right inguinal hernia repair. No pelvic free fluid. Musculoskeletal: Negative. IMPRESSION: 1. Complicated Sigmoid Diverticulitis with an 8.6 cm Pericolic Abscess in the left lower abdomen. Recommend hospital admission and consultation of both Surgery and Interventional Radiology for treatment planning and drainage. 2. Reactive appearing abdominal lymph nodes. No other complicating features identified. 3. Hepatic steatosis. 4. 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 5. Incidental old granulomatous disease. Electronically Signed: By: Genevie Ann M.D. On: 11/24/2016 12:18   Assessment/Plan Sigmoid diverticulitis with abscess - IV Zosyn - IR consult for percutaneous drainage - CBC in AM    - general surgery will follow. Discussed surgical plan of care with patient including the possibility of sigmoid colectomy/colostomy this admission if he fails to improve with medical management/drainage, as well as outpatient follow up to discuss elective sigmoid colectomy.  Jill Alexanders, [facility] Surgery 11/24/2016, 2:10 PM Pager: [phone] Consults: [phone] Mon-Fri 7:00 am-4:30 pm Sat-Sun 7:00 am-11:30 am

## 2016-11-24 NOTE — H&P (Signed)
History and Physical    Matthew Reid JXB:[id number] DOB: 1960-08-26 DOA: 11/24/2016  PCP: Farris Has, MD Patient coming from: home  Chief Complaint: abd pain  HPI: Oral [name] is a 56 y.o. male with medical history significant of diabetes, recurrent diverticulitis, hypertension. Patient reports multiple episodes of diverticulitis over the last several years. Current symptoms started approximately 2 weeks prior to admission with patient's typical diverticular symptoms of abdominal pain and subjective fevers.patient localized to the left lower quadrant. Described as dull and intermittently stabbing. Associated with nausea but no emesis. Started on ciprofloxacin and Flagyl approximately 10 days prior to admission without improvement. Patient had an outpatient CT scan ordered by his primary care physician and was called after the results came back showing a large diverticular abscess.   Denies lower extremity swelling, dysuria, frequency, flank pain, chest pain, short of breath, palpitations, neck stiffness, focal neurological deficit.  ED Course: start on Zosyn. Surgery and interventional radiology consult.  Review of Systems: As per HPI otherwise all other systems reviewed and are negative  Ambulatory Status:no restrictions  Past Medical History:  Diagnosis Date  . Diabetes mellitus without complication (HCC)   . Diverticulitis   . Hypertension     Past Surgical History:  Procedure Laterality Date  . HERNIA REPAIR    . JOINT REPLACEMENT    . TONSILLECTOMY      Social History   Social History  . Marital status: Married    Spouse name: N/A  . Number of children: N/A  . Years of education: N/A   Occupational History  . Not on file.   Social History Main Topics  . Smoking status: Not on file  . Smokeless tobacco: Not on file  . Alcohol use Not on file  . Drug use: Unknown  . Sexual activity: Not on file   Other Topics Concern  . Not on file   Social History  Narrative  . No narrative on file    No Known Allergies  Family History  Problem Relation Age of Onset  . Hypertension Father       Prior to Admission medications   Not on File    Physical Exam: Vitals:   11/24/16 1415 11/24/16 1430 11/24/16 1445 11/24/16 1500  BP: (!) 152/104 (!) 106/91 (!) 147/94 (!) 144/90  Pulse: 94 96 98 96  Resp: 20 10 (!) 27 17  Temp:      TempSrc:      SpO2: 97% 96% 95% 97%  Weight:      Height:         General:  Appears calm and comfortable Eyes:  PERRL, EOMI, normal lids, iris ENT:  grossly normal hearing, lips & tongue, mmm Neck:  no LAD, masses or thyromegaly Cardiovascular:  RRR, no m/r/g. No LE edema.  Respiratory:  CTA bilaterally, no w/r/r. Normal respiratory effort. Abdomen:  Soft, mildly distended. Tender to deep palpation Skin:  no rash or induration seen on limited exam Musculoskeletal:  grossly normal tone BUE/BLE, good ROM, no bony abnormality Psychiatric:  grossly normal mood and affect, speech fluent and appropriate, AOx3 Neurologic:  CN 2-12 grossly intact, moves all extremities in coordinated fashion, sensation intact  Labs on Admission: I have personally reviewed following labs and imaging studies  CBC:  Recent Labs Lab 11/24/16 1330  WBC 17.1*  HGB 15.4  HCT 47.2  MCV 90.1  PLT 391   Basic Metabolic Panel:  Recent Labs Lab 11/24/16 1256  NA 131*  K 4.4  CL  97*  CO2 20*  GLUCOSE 134*  BUN 8  CREATININE 0.90  CALCIUM 8.9   GFR: Estimated Creatinine Clearance: 109.1 mL/min (by C-G formula based on SCr of 0.9 mg/dL). Liver Function Tests:  Recent Labs Lab 11/24/16 1256  AST 26  ALT 32  ALKPHOS 95  BILITOT 0.5  PROT 7.8  ALBUMIN 3.5   No results for input(s): LIPASE, AMYLASE in the last 168 hours. No results for input(s): AMMONIA in the last 168 hours. Coagulation Profile: No results for input(s): INR, PROTIME in the last 168 hours. Cardiac Enzymes: No results for input(s): CKTOTAL, CKMB,  CKMBINDEX, TROPONINI in the last 168 hours. BNP (last 3 results) No results for input(s): PROBNP in the last 8760 hours. HbA1C: No results for input(s): HGBA1C in the last 72 hours. CBG: No results for input(s): GLUCAP in the last 168 hours. Lipid Profile: No results for input(s): CHOL, HDL, LDLCALC, TRIG, CHOLHDL, LDLDIRECT in the last 72 hours. Thyroid Function Tests: No results for input(s): TSH, T4TOTAL, FREET4, T3FREE, THYROIDAB in the last 72 hours. Anemia Panel: No results for input(s): VITAMINB12, FOLATE, FERRITIN, TIBC, IRON, RETICCTPCT in the last 72 hours. Urine analysis:    Component Value Date/Time   COLORURINE STRAW (A) 11/24/2016 1341   APPEARANCEUR CLEAR 11/24/2016 1341   LABSPEC 1.044 (H) 11/24/2016 1341   PHURINE 6.0 11/24/2016 1341   GLUCOSEU NEGATIVE 11/24/2016 1341   HGBUR SMALL (A) 11/24/2016 1341   BILIRUBINUR NEGATIVE 11/24/2016 1341   KETONESUR 20 (A) 11/24/2016 1341   PROTEINUR NEGATIVE 11/24/2016 1341   NITRITE NEGATIVE 11/24/2016 1341   LEUKOCYTESUR NEGATIVE 11/24/2016 1341    Creatinine Clearance: Estimated Creatinine Clearance: 109.1 mL/min (by C-G formula based on SCr of 0.9 mg/dL).  Sepsis Labs: (procalcitonin:4,lacticidven:4) )No results found for this or any previous visit (from the past 240 hour(s)).   Radiological Exams on Admission: Ct Abdomen Pelvis W Contrast  Addendum Date: 11/24/2016   ADDENDUM REPORT: 11/24/2016 12:33 ADDENDUM: Salient findings discussed by telephone with Dr. Mila Palmer on 11/24/2016 at 1219 hours. Electronically Signed   By: Odessa Fleming M.D.   On: 11/24/2016 12:33   Result Date: 11/24/2016 CLINICAL DATA:  56 year old male with persistent left lower quadrant abdominal pain, low-grade fever, nausea vomiting. Diagnosed with diverticulitis and started on antibiotics 11/13/2016, but symptoms persist. EXAM: CT ABDOMEN AND PELVIS WITH CONTRAST TECHNIQUE: Multidetector CT imaging of the abdomen and pelvis was  performed using the standard protocol following bolus administration of intravenous contrast. CONTRAST:  ISOVUE-300 IOPAMIDOL (ISOVUE-300) INJECTION 61% COMPARISON:  None. FINDINGS: Lower chest: Negative lung bases. No pericardial or pleural effusion. Small calcified right hilar lymph nodes compatible with granulomatous disease. Hepatobiliary: Decreased density in keeping with hepatic steatosis. Negative gallbladder. No biliary ductal enlargement. Pancreas: Negative. Spleen: Negative; multiple punctate calcified granulomas. Adrenals/Urinary Tract: Possible small right adrenal gland myelolipoma (series 2, image 26, benign), with otherwise normal adrenal glands. Bilateral renal enhancement and contrast excretion is normal. 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 The left ureters are nondilated despite extension through the left retroperitoneal inflammation. Unremarkable urinary bladder. Stomach/Bowel: Negative rectum. Moderate to severe inflammation surrounding the sigmoid colon over a segment of about 12 cm with numerous diverticula in the region. Sigmoid wall thickening up to 2 cm. Along the superior and posterior aspect of the sigmoid segment there is a large oval gas and fluid containing collection encompassing 64 x 86 x 63 mm (AP by transverse by CC) with an estimated volume of 173 mL.  See series 2, image 65, sagittal image 90. Inflammation surrounding this structure in tracking along the left pelvic side wall. No other extraluminal gas or fluid identified. Diverticulosis continues throughout the more proximal large bowel but with no other active inflammation identified. Normal appendix. Negative terminal ileum. No dilated small bowel. Negative stomach and duodenum. Vascular/Lymphatic: Major arterial structures in the abdomen and pelvis are patent. Portal venous system is patent. New line reactive appearing left retroperitoneal and mesenteric lymph nodes about the abnormal sigmoid  colon. Reproductive: Evidence of prior vasectomy. Other: Evidence of prior right inguinal hernia repair. No pelvic free fluid. Musculoskeletal: Negative. IMPRESSION: 1. Complicated Sigmoid Diverticulitis with an 8.6 cm Pericolic Abscess in the left lower abdomen. Recommend hospital admission and consultation of both Surgery and Interventional Radiology for treatment planning and drainage. 2. Reactive appearing abdominal lymph nodes. No other complicating features identified. 3. Hepatic steatosis. 4. 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 5. Incidental old granulomatous disease. Electronically Signed: By: Odessa Fleming M.D. On: 11/24/2016 12:18    EKG: Independently reviewed. Sinus. Tachy. No ACS  Assessment/Plan Active Problems:   Intestinal diverticular abscess   Diverticulitis   Essential hypertension   Leukocytosis   HLD (hyperlipidemia)   Type 2 diabetes mellitus with complication, without long-term current use of insulin (HCC)   Diverticulitis/abscess: no perforation. CCS and IR evaluated pt. Started on zosyn in ED. Multiple bouts of diverticulitis - IR to perform drainage urgently - f/u IR recs - continue Zosyn - needs ourtpt f/u w/ CCS for likely partial colectomy due to recurrent diverticulitis and now abscess.  - Clear liquid diet - consider advancing on 3:30.   HTN: - continue metop - Hydralazine PRN  Diabetes: on metformin only - SSI - Hold metformin  HLD: - continue atorvastatin    DVT prophylaxis: SCD  Code Status: full  Family Communication: Mother  Disposition Plan: pending IR drainage of abscess and further recommendations  Consults called: IR, CCS  Admission status: inpt    Ozella Rocks MD [facility]  If 7PM-7AM, please contact night-coverage [email or web address] Password [email or web address]  11/24/2016, 3:31 PM

## 2016-11-24 NOTE — Consult Note (Signed)
Chief Complaint: diverticular abscess  Referring Physician:Dr. Tanna Furry  Supervising Physician: Aletta Edouard  Patient Status: [facility] - In-[facility]  HPI: Matthew Reid is a 56 y.o. male who was diagnosed with diverticulitis about 11 days ago by his PCP.  He was placed on Cipro and Flagyl for the last 10 days.  He completed his abx course yesterday, but still was feeling very poor with decrease in his energy level.  He tried to go to work yesterday but only made it for 3 hours.  He has anorexia for fear of not knowing how his bowels will react.  He admits to some nausea.  His acute pain from the beginning is improved, but now he has a residual anterior abdominal pressure.  He saw his PCP again today and had a CT scan ordered.  This revealed a large LLQ abdominal abscess.  He was referred to the hospital for admission, IV abx therapy, and drain placement.  Past Medical History:  Past Medical History:  Diagnosis Date  . Diabetes mellitus without complication ([place])   . Diverticulitis   . Hypertension     Past Surgical History:  Past Surgical History:  Procedure Laterality Date  . HERNIA REPAIR    . JOINT REPLACEMENT    . TONSILLECTOMY      Family History:  Family History  Problem Relation Age of Onset  . Hypertension Father     Social History:  has no tobacco, alcohol, and drug history on file.  Allergies: No Known Allergies  Medications: Medications reviewed in epic  Please HPI for pertinent positives, otherwise complete 10 system ROS negative.  Mallampati Score: MD Evaluation Airway: WNL Heart: WNL Abdomen: Other (comments) Abdomen comments: mild suprapubic tenderness Chest/ Lungs: WNL ASA  Classification: 2 Mallampati/Airway Score: One  Physical Exam: BP (!) 144/90   Pulse 96   Temp 98 F (36.7 C) (Oral)   Resp 17   Ht 5' 7"  (1.702 m)   Wt 240 lb (108.9 kg)   SpO2 97%   BMI 37.59 kg/m  Body mass index is 37.59 kg/m. General: pleasant, WD, WN white male  who is laying in bed in NAD HEENT: head is normocephalic, atraumatic.  Sclera are noninjected.  PERRL.  Ears and nose without any masses or lesions.  Mouth is pink and moist Heart: regular, rate, and rhythm.  Normal s1,s2. No obvious murmurs, gallops, or rubs noted.  Palpable radial pulses bilaterally Lungs: CTAB, no wheezes, rhonchi, or rales noted.  Respiratory effort nonlabored Abd: soft, slightly tender over his suprapubic region, obese, +BS, no masses, hernias, or organomegaly Psych: A&Ox3 with an appropriate affect.   Labs: Results for orders placed or performed during the hospital encounter of 11/24/16 (from the past 48 hour(s))  Comprehensive metabolic panel     Status: Abnormal   Collection Time: 11/24/16 12:56 PM  Result Value Ref Range   Sodium 131 (L) 135 - 145 mmol/L   Potassium 4.4 3.5 - 5.1 mmol/L   Chloride 97 (L) 101 - 111 mmol/L   CO2 20 (L) 22 - 32 mmol/L   Glucose, Bld 134 (H) 65 - 99 mg/dL   BUN 8 6 - 20 mg/dL   Creatinine, Ser 0.90 0.61 - 1.24 mg/dL   Calcium 8.9 8.9 - 10.3 mg/dL   Total Protein 7.8 6.5 - 8.1 g/dL   Albumin 3.5 3.5 - 5.0 g/dL   AST 26 15 - 41 U/L   ALT 32 17 - 63 U/L   Alkaline Phosphatase  95 38 - 126 U/L   Total Bilirubin 0.5 0.3 - 1.2 mg/dL   GFR calc non Af Amer >60 >60 mL/min   GFR calc Af Amer >60 >60 mL/min    Comment: (NOTE) The eGFR has been calculated using the CKD EPI equation. This calculation has not been validated in all clinical situations. eGFR's persistently <60 mL/min signify possible Chronic Kidney Disease.    Anion gap 14 5 - 15  CBC     Status: Abnormal   Collection Time: 11/24/16  1:30 PM  Result Value Ref Range   WBC 17.1 (H) 4.0 - 10.5 K/uL   RBC 5.24 4.22 - 5.81 MIL/uL   Hemoglobin 15.4 13.0 - 17.0 g/dL   HCT 47.2 39.0 - 52.0 %   MCV 90.1 78.0 - 100.0 fL   MCH 29.4 26.0 - 34.0 pg   MCHC 32.6 30.0 - 36.0 g/dL   RDW 13.3 11.5 - 15.5 %   Platelets 391 150 - 400 K/uL  Urinalysis, Routine w reflex microscopic      Status: Abnormal   Collection Time: 11/24/16  1:41 PM  Result Value Ref Range   Color, Urine STRAW (A) YELLOW   APPearance CLEAR CLEAR   Specific Gravity, Urine 1.044 (H) 1.005 - 1.030   pH 6.0 5.0 - 8.0   Glucose, UA NEGATIVE NEGATIVE mg/dL   Hgb urine dipstick SMALL (A) NEGATIVE   Bilirubin Urine NEGATIVE NEGATIVE   Ketones, ur 20 (A) NEGATIVE mg/dL   Protein, ur NEGATIVE NEGATIVE mg/dL   Nitrite NEGATIVE NEGATIVE   Leukocytes, UA NEGATIVE NEGATIVE   RBC / HPF 0-5 0 - 5 RBC/hpf   WBC, UA 0-5 0 - 5 WBC/hpf   Bacteria, UA NONE SEEN NONE SEEN   Squamous Epithelial / LPF NONE SEEN NONE SEEN  I-Stat CG4 Lactic Acid, ED     Status: None   Collection Time: 11/24/16  2:21 PM  Result Value Ref Range   Lactic Acid, Venous 1.39 0.5 - 1.9 mmol/L    Imaging: Ct Abdomen Pelvis W Contrast  Addendum Date: 11/24/2016   ADDENDUM REPORT: 11/24/2016 12:33 ADDENDUM: Salient findings discussed by telephone with Dr. Jonathon Jordan on 11/24/2016 at 1219 hours. Electronically Signed   By: Genevie Ann M.D.   On: 11/24/2016 12:33   Result Date: 11/24/2016 CLINICAL DATA:  56 year old male with persistent left lower quadrant abdominal pain, low-grade fever, nausea vomiting. Diagnosed with diverticulitis and started on antibiotics 11/13/2016, but symptoms persist. EXAM: CT ABDOMEN AND PELVIS WITH CONTRAST TECHNIQUE: Multidetector CT imaging of the abdomen and pelvis was performed using the standard protocol following bolus administration of intravenous contrast. CONTRAST:  168m ISOVUE-300 IOPAMIDOL (ISOVUE-300) INJECTION 61% COMPARISON:  None. FINDINGS: Lower chest: Negative lung bases. No pericardial or pleural effusion. Small calcified right hilar lymph nodes compatible with granulomatous disease. Hepatobiliary: Decreased density in keeping with hepatic steatosis. Negative gallbladder. No biliary ductal enlargement. Pancreas: Negative. Spleen: Negative; multiple punctate calcified granulomas. Adrenals/Urinary  Tract: Possible small right adrenal gland myelolipoma (series 2, image 26, benign), with otherwise normal adrenal glands. Bilateral renal enhancement and contrast excretion is normal. 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 The left ureters are nondilated despite extension through the left retroperitoneal inflammation. Unremarkable urinary bladder. Stomach/Bowel: Negative rectum. Moderate to severe inflammation surrounding the sigmoid colon over a segment of about 12 cm with numerous diverticula in the region. Sigmoid wall thickening up to 2 cm. Along the superior and posterior aspect of the sigmoid segment there  is a large oval gas and fluid containing collection encompassing 64 x 86 x 63 mm (AP by transverse by CC) with an estimated volume of 173 mL. See series 2, image 65, sagittal image 90. Inflammation surrounding this structure in tracking along the left pelvic side wall. No other extraluminal gas or fluid identified. Diverticulosis continues throughout the more proximal large bowel but with no other active inflammation identified. Normal appendix. Negative terminal ileum. No dilated small bowel. Negative stomach and duodenum. Vascular/Lymphatic: Major arterial structures in the abdomen and pelvis are patent. Portal venous system is patent. New line reactive appearing left retroperitoneal and mesenteric lymph nodes about the abnormal sigmoid colon. Reproductive: Evidence of prior vasectomy. Other: Evidence of prior right inguinal hernia repair. No pelvic free fluid. Musculoskeletal: Negative. IMPRESSION: 1. Complicated Sigmoid Diverticulitis with an 8.6 cm Pericolic Abscess in the left lower abdomen. Recommend hospital admission and consultation of both Surgery and Interventional Radiology for treatment planning and drainage. 2. Reactive appearing abdominal lymph nodes. No other complicating features identified. 3. Hepatic steatosis. 4. Incidental duplicated left renal collecting  system and ureter. 5. Incidental old granulomatous disease. Electronically Signed: By: Genevie Ann M.D. On: 11/24/2016 12:18    Assessment/Plan 1. Diverticulitis with abscess  Dr. Kathlene Cote has reviewed his imaging and feels he is a good candidate for drain placement. He is NPO and his labs have been reviewed.  He does not take any thinners at home.  We will proceed with drain placement today.  Zosyn has been initiated and IM and surgery have seen the patient as well. Risks and benefits discussed with the patient including bleeding, infection, damage to adjacent structures, bowel perforation/fistula connection, and sepsis. All of the patient's questions were answered, patient is agreeable to proceed. Consent signed and in chart.  Thank you for this interesting consult.  I greatly enjoyed meeting Travus Oren and look forward to participating in their care.  A copy of this report was sent to the requesting provider on this date.  Electronically Signed: Henreitta Cea 11/24/2016, 3:32 PM   I spent a total of 40 Minutes    in face to face in clinical consultation, greater than 50% of which was counseling/coordinating care for diverticular abscess

## 2016-11-24 NOTE — ED Notes (Signed)
IR PA at bedside getting informed consent

## 2016-11-24 NOTE — ED Notes (Signed)
Surgery PA at bedside.  

## 2016-11-24 NOTE — ED Notes (Addendum)
Dr. Konrad Dolores at bedside assessing pt.

## 2016-11-24 NOTE — Procedures (Signed)
Interventional Radiology Procedure Note  Procedure: CT guided percutaneous catheter drainage of diverticular abscess  Complications: None  Estimated Blood Loss: None  Findings: Aspiration at level of LLQ sigmoid diverticular abscess yielded grossly purulent fluid.  12 Fr drain placed and attached to suction bulb.  [facility] Reid. Fredia Sorrow, M.D Pager:  [phone]

## 2016-11-24 NOTE — ED Notes (Signed)
Returned from procedure

## 2016-11-25 LAB — COMPREHENSIVE METABOLIC PANEL
ALBUMIN: 3 g/dL — AB (ref 3.5–5.0)
ALK PHOS: 76 U/L (ref 38–126)
ALT: 26 U/L (ref 17–63)
ANION GAP: 13 (ref 5–15)
AST: 20 U/L (ref 15–41)
BILIRUBIN TOTAL: 0.6 mg/dL (ref 0.3–1.2)
BUN: 9 mg/dL (ref 6–20)
CO2: 23 mmol/L (ref 22–32)
Calcium: 8.7 mg/dL — ABNORMAL LOW (ref 8.9–10.3)
Chloride: 97 mmol/L — ABNORMAL LOW (ref 101–111)
Creatinine, Ser: 1.05 mg/dL (ref 0.61–1.24)
GFR calc Af Amer: >60 mL/min (ref >60)
GFR calc non Af Amer: >60 mL/min (ref >60)
GLUCOSE: 113 mg/dL — AB (ref 65–99)
POTASSIUM: 4.1 mmol/L (ref 3.5–5.1)
SODIUM: 133 mmol/L — AB (ref 135–145)
TOTAL PROTEIN: 6.8 g/dL (ref 6.5–8.1)

## 2016-11-25 LAB — GLUCOSE, CAPILLARY
Glucose-Capillary: 131 mg/dL — ABNORMAL HIGH (ref 65–99)
Glucose-Capillary: 94 mg/dL (ref 65–99)

## 2016-11-25 LAB — CBC
HEMATOCRIT: 41.1 % (ref 39.0–52.0)
HEMOGLOBIN: 13.3 g/dL (ref 13.0–17.0)
MCH: 29.4 pg (ref 26.0–34.0)
MCHC: 32.4 g/dL (ref 30.0–36.0)
MCV: 90.9 fL (ref 78.0–100.0)
Platelets: 368 10*3/uL (ref 150–400)
RBC: 4.52 MIL/uL (ref 4.22–5.81)
RDW: 13.4 % (ref 11.5–15.5)
WBC: 11.1 10*3/uL — AB (ref 4.0–10.5)

## 2016-11-25 LAB — URINE CULTURE: Culture: NO GROWTH

## 2016-11-25 MED ORDER — INSULIN ASPART 100 UNIT/ML ~~LOC~~ SOLN
0.0000 [IU] | Freq: Three times a day (TID) | SUBCUTANEOUS | Status: DC
  Administered 2016-11-25 – 2016-11-27 (×3): 1 [IU] via SUBCUTANEOUS

## 2016-11-25 MED ORDER — SODIUM CHLORIDE 0.9 % IV SOLN
INTRAVENOUS | Status: AC
  Administered 2016-11-25: 22:00:00 via INTRAVENOUS

## 2016-11-25 NOTE — Progress Notes (Signed)
Patient arrived to floor from ED. Report received from [place], [place]. Patient stable, alert and oriented. Family at bedside.

## 2016-11-25 NOTE — Progress Notes (Signed)
[facility] Progress Note     Subjective: CC: diverticulitis Patient with improvement in abdominal pain, still sore and tender to deep palpation of LLQ. Denies n/v. Passing flatus. Feels hungry, currently tolerating clears.  UOP good. VSS.   Objective: Vital signs in last 24 hours: Temp:  [97.9 F (36.6 C)-99.4 F (37.4 C)] 97.9 F (36.6 C) (10/17 0428) Pulse Rate:  [69-108] 69 (10/17 0428) Resp:  [10-27] 18 (10/17 0428) BP: (106-162)/(57-123) 109/57 (10/17 0428) SpO2:  [91 %-99 %] 96 % (10/17 0428) Weight:  [108.6 kg (239 lb 6.4 oz)-108.9 kg (240 lb)] 108.6 kg (239 lb 6.4 oz) (10/16 2019) Last BM Date: 11/24/16  Intake/Output from previous day: 10/16 0701 - 10/17 0700 In: 1526.7 [P.O.:120; I.V.:1256.7; IV Piggyback:150] Out: 185 [Drains:85] Intake/Output this shift: No intake/output data recorded.  PE: Gen:  Alert, NAD, pleasant Card:  Regular rate and rhythm, pedal pulses 2+ BL Pulm:  Normal effort, clear to auscultation bilaterally Abd: Soft, TTP in LLQ, non-distended, no rebound or guarding, bowel sounds present, no HSM, drain in LLQ with sanguinous drainage Skin: warm and dry, no rashes  Psych: A&Ox3   Lab Results:   Recent Labs  11/24/16 1330 11/25/16 0412  WBC 17.1* 11.1*  HGB 15.4 13.3  HCT 47.2 41.1  PLT 391 368   BMET  Recent Labs  11/24/16 1256 11/25/16 0412  NA 131* 133*  K 4.4 4.1  CL 97* 97*  CO2 20* 23  GLUCOSE 134* 113*  BUN 8 9  CREATININE 0.90 1.05  CALCIUM 8.9 8.7*   PT/INR  Recent Labs  11/24/16 1510  LABPROT 13.7  INR 1.06   CMP     Component Value Date/Time   NA 133 (L) 11/25/2016 0412   K 4.1 11/25/2016 0412   CL 97 (L) 11/25/2016 0412   CO2 23 11/25/2016 0412   GLUCOSE 113 (H) 11/25/2016 0412   BUN 9 11/25/2016 0412   CREATININE 1.05 11/25/2016 0412   CALCIUM 8.7 (L) 11/25/2016 0412   PROT 6.8 11/25/2016 0412   ALBUMIN 3.0 (L) 11/25/2016 0412   AST 20 11/25/2016 0412   ALT 26 11/25/2016 0412    ALKPHOS 76 11/25/2016 0412   BILITOT 0.6 11/25/2016 0412   GFRNONAA >60 11/25/2016 0412   GFRAA >60 11/25/2016 0412    Studies/Results: Ct Abdomen Pelvis W Contrast  Addendum Date: 11/24/2016   ADDENDUM REPORT: 11/24/2016 12:33 ADDENDUM: Salient findings discussed by telephone with Dr. Mila Palmer on 11/24/2016 at 1219 hours. Electronically Signed   By: Odessa Fleming M.D.   On: 11/24/2016 12:33   Result Date: 11/24/2016 CLINICAL DATA:  56 year old male with persistent left lower quadrant abdominal pain, low-grade fever, nausea vomiting. Diagnosed with diverticulitis and started on antibiotics 11/13/2016, but symptoms persist. EXAM: CT ABDOMEN AND PELVIS WITH CONTRAST TECHNIQUE: Multidetector CT imaging of the abdomen and pelvis was performed using the standard protocol following bolus administration of intravenous contrast. CONTRAST:  ISOVUE-300 IOPAMIDOL (ISOVUE-300) INJECTION 61% COMPARISON:  None. FINDINGS: Lower chest: Negative lung bases. No pericardial or pleural effusion. Small calcified right hilar lymph nodes compatible with granulomatous disease. Hepatobiliary: Decreased density in keeping with hepatic steatosis. Negative gallbladder. No biliary ductal enlargement. Pancreas: Negative. Spleen: Negative; multiple punctate calcified granulomas. Adrenals/Urinary Tract: Possible small right adrenal gland myelolipoma (series 2, image 26, benign), with otherwise normal adrenal glands. Bilateral renal enhancement and contrast excretion is normal. 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 The left ureters are nondilated despite extension through  the left retroperitoneal inflammation. Unremarkable urinary bladder. Stomach/Bowel: Negative rectum. Moderate to severe inflammation surrounding the sigmoid colon over a segment of about 12 cm with numerous diverticula in the region. Sigmoid wall thickening up to 2 cm. Along the superior and posterior aspect of the sigmoid segment  there is a large oval gas and fluid containing collection encompassing 64 x 86 x 63 mm (AP by transverse by CC) with an estimated volume of 173 mL. See series 2, image 65, sagittal image 90. Inflammation surrounding this structure in tracking along the left pelvic side wall. No other extraluminal gas or fluid identified. Diverticulosis continues throughout the more proximal large bowel but with no other active inflammation identified. Normal appendix. Negative terminal ileum. No dilated small bowel. Negative stomach and duodenum. Vascular/Lymphatic: Major arterial structures in the abdomen and pelvis are patent. Portal venous system is patent. New line reactive appearing left retroperitoneal and mesenteric lymph nodes about the abnormal sigmoid colon. Reproductive: Evidence of prior vasectomy. Other: Evidence of prior right inguinal hernia repair. No pelvic free fluid. Musculoskeletal: Negative. IMPRESSION: 1. Complicated Sigmoid Diverticulitis with an 8.6 cm Pericolic Abscess in the left lower abdomen. Recommend hospital admission and consultation of both Surgery and Interventional Radiology for treatment planning and drainage. 2. Reactive appearing abdominal lymph nodes. No other complicating features identified. 3. Hepatic steatosis. 4. 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 5. Incidental old granulomatous disease. Electronically Signed: By: Odessa FlemingH  Hall M.D. On: 11/24/2016 12:18   Ct Image Guided Drainage By Percutaneous Catheter  Result Date: 11/24/2016 CLINICAL DATA:  Large left lower quadrant sigmoid diverticular peritoneal abscess requiring drainage. EXAM: CT GUIDED CATHETER DRAINAGE OF PERITONEAL ABSCESS COMPARISON:  CT of the abdomen and pelvis performed earlier today. ANESTHESIA/SEDATION: 2.0 mg IV Versed 100 mcg IV Fentanyl Total Moderate Sedation Time:  18 minutes The patient's level of consciousness and physiologic status were continuously monitored during the procedure by  Radiology nursing. PROCEDURE: The procedure, risks, benefits, and alternatives were explained to the patient. Questions regarding the procedure were encouraged and answered. The patient understands and consents to the procedure. A time out was performed prior to initiating the procedure. The left lower abdominal wall was prepped with chlorhexidine in a sterile fashion, and a sterile drape was applied covering the operative field. A sterile gown and sterile gloves were used for the procedure. Local anesthesia was provided with 1% Lidocaine. CT was performed in a supine position with the left side rolled up slightly. Under CT guidance, an 18 gauge trocar needle was advanced into a left lower quadrant diverticular abscess. A fluid sample was aspirated and sent for culture analysis. A guidewire was advanced into the collection. The percutaneous tract was dilated and a 12 French percutaneous drainage catheter advanced over the wire. Catheter positioning was confirmed by CT. The catheter was flushed and connected to a suction bulb. It was secured at the skin with a Prolene retention suture and StatLock device. COMPLICATIONS: None FINDINGS: Large left lower quadrant diverticular abscess is identified superior and posterior to the sigmoid colon. Aspiration yielded grossly purulent greenish white liquid. A 12 French drain was placed and is draining well after placement. IMPRESSION: CT-guided percutaneous catheter drainage of sigmoid diverticular abscess. A 12 French drainage catheter was placed and attached to suction bulb drainage. Drain output will be followed. A fluid sample was sent for culture analysis. Electronically Signed   By: Irish LackGlenn  Yamagata M.D.   On: 11/24/2016 16:39    Anti-infectives: Anti-infectives    Start  Dose/Rate Route Frequency Ordered Stop   11/24/16 2230  piperacillin-tazobactam (ZOSYN) IVPB 3.375 g     3.375 g 12.5 mL/hr over 240 Minutes Intravenous Every 8 hours 11/24/16 2205      11/24/16 1330  piperacillin-tazobactam (ZOSYN) IVPB 3.375 g     3.375 g 100 mL/hr over 30 Minutes Intravenous  Once 11/24/16 1327 11/24/16 1426       Assessment/Plan HTN Diabetes HLD  Sigmoid diverticulitis with abscess - s/p IR drainage 10/16 - cx pending - WBC 11.1 from 17 yesterday, afebrile - some improvement in pain - 85 cc output from drain - management per IR - continue IV abx   FEN: clears - advance to full liqs VTE: SCDs ID: IV Zosyn (10/16>>)  Plan: advance to fulls. Continue IV abx and drain. Discussed surgical plan of care with patient including the possibility of sigmoid colectomy/colostomy this admission if he fails to improve with medical management/drainage, as well as outpatient follow up to discuss elective sigmoid colectomy.  LOS: 1 day    Wells Guiles , [facility] 11/25/2016, 8:42 AM Pager: [phone] Consults: [phone] Mon-Fri 7:00 am-4:30 pm Sat-Sun 7:00 am-11:30 am

## 2016-11-25 NOTE — Progress Notes (Signed)
PROGRESS NOTE   Matthew Reid  ZOX:[id number]    DOB: 1960-03-08    DOA: 11/24/2016  PCP: Farris Has, MD   I have briefly reviewed patients previous medical records in [facility].  Brief Narrative:  56 year old male with PMH of DM, recurrent diverticulitis, HTN, presented with approximately 2 weeks history of abdominal pain and subjective fevers, no significant improvement after 10 days of ciprofloxacin and Flagyl, outpatient CT abdomen confirmed large diverticular abscess. IR placed a percutaneous drain. Broad-spectrum IV antibiotics. CCS consulted.   Assessment & Plan:   Active Problems:   Intestinal diverticular abscess   Diverticulitis   Essential hypertension   Leukocytosis   HLD (hyperlipidemia)   Type 2 diabetes mellitus with complication, without long-term current use of insulin (HCC)   Sigmoid diverticulitis with abscess Status post PC drain placed by IR on 10/16. Culture pending. Continue IV Zosyn. Clinically improved. IR and CCS following. Diet advanced to full liquids. As per surgery, possibility of sigmoid colectomy/colostomy this admission if he fails to improve with medical management versus outpatient elective colectomy.  Essential hypertension Controlled on metoprolol.  Type II DM Holding metformin. SSI.  Hyperlipidemia Continue atorvastatin  DVT prophylaxis: SCDs Code Status: Full Family Communication: None at bedside Disposition: DC home when medically improved, possibly in the next 2-3 days unless surgery planned.   Consultants:  Interventional radiology General surgery   Procedures:  Percutaneous drain  Antimicrobials:  IV Zosyn    Subjective: Feels much better. Improved subjective fevers. Left lower quadrant abdominal pain resolved. Now only reports pain at [facility] drain site.   ROS: No chest pain or dyspnea.  Objective:  Vitals:   11/24/16 1930 11/24/16 2019 11/25/16 0428 11/25/16 1425  BP: 110/74 114/66 (!) 109/57 111/62    Pulse: 90 88 69 72  Resp: 17 20 18 18   Temp:  99.4 F (37.4 C) 97.9 F (36.6 C) 98 F (36.7 C)  TempSrc:  Oral  Oral  SpO2: 95% 97% 96% 99%  Weight:  108.6 kg (239 lb 6.4 oz)    Height:  5\' 7"  (1.702 m)      Examination:  General exam: Young male, moderately built and obese lying comfortably supine in bed. Respiratory system: Clear to auscultation. Respiratory effort normal. Cardiovascular system: S1 & S2 heard, RRR. No JVD, murmurs, rubs, gallops or clicks. No pedal edema. Gastrointestinal system: Abdomen is mildly tender in left lower quadrant without peritoneal signs. LLQ PC drain with mild blood stained purulent fluid. No organomegaly or masses felt. Normal bowel sounds heard. Central nervous system: Alert and oriented. No focal neurological deficits. Extremities: Symmetric 5 x 5 power. Skin: No rashes, lesions or ulcers Psychiatry: Judgement and insight appear normal. Mood & affect appropriate.     Data Reviewed: I have personally reviewed following labs and imaging studies  CBC:  Recent Labs Lab 11/24/16 1330 11/25/16 0412  WBC 17.1* 11.1*  HGB 15.4 13.3  HCT 47.2 41.1  MCV 90.1 90.9  PLT 391 368   Basic Metabolic Panel:  Recent Labs Lab 11/24/16 1256 11/25/16 0412  NA 131* 133*  K 4.4 4.1  CL 97* 97*  CO2 20* 23  GLUCOSE 134* 113*  BUN 8 9  CREATININE 0.90 1.05  CALCIUM 8.9 8.7*   Liver Function Tests:  Recent Labs Lab 11/24/16 1256 11/25/16 0412  AST 26 20  ALT 32 26  ALKPHOS 95 76  BILITOT 0.5 0.6  PROT 7.8 6.8  ALBUMIN 3.5 3.0*   Coagulation Profile:  Recent  Labs Lab 11/24/16 1510  INR 1.06    Recent Results (from the past 240 hour(s))  Urine Culture     Status: None   Collection Time: 11/24/16  1:41 PM  Result Value Ref Range Status   Specimen Description URINE, CLEAN CATCH  Final   Special Requests NONE  Final   Culture NO GROWTH  Final   Report Status 11/25/2016 FINAL  Final  Aerobic/Anaerobic Culture (surgical/deep  wound)     Status: None (Preliminary result)   Collection Time: 11/24/16  4:11 PM  Result Value Ref Range Status   Specimen Description ABSCESS  Final   Special Requests ASPIRATED FROM DIVERTICULAR ABSCESS  Final   Gram Stain   Final    ABUNDANT WBC PRESENT, PREDOMINANTLY PMN MODERATE GRAM POSITIVE COCCI IN CHAINS FEW GRAM NEGATIVE RODS    Culture CULTURE REINCUBATED FOR BETTER GROWTH  Final   Report Status PENDING  Incomplete         Radiology Studies: Ct Abdomen Pelvis W Contrast  Addendum Date: 11/24/2016   ADDENDUM REPORT: 11/24/2016 12:33 ADDENDUM: Salient findings discussed by telephone with Dr. Mila Palmer on 11/24/2016 at 1219 hours. Electronically Signed   By: Odessa Fleming M.D.   On: 11/24/2016 12:33   Result Date: 11/24/2016 CLINICAL DATA:  56 year old male with persistent left lower quadrant abdominal pain, low-grade fever, nausea vomiting. Diagnosed with diverticulitis and started on antibiotics 11/13/2016, but symptoms persist. EXAM: CT ABDOMEN AND PELVIS WITH CONTRAST TECHNIQUE: Multidetector CT imaging of the abdomen and pelvis was performed using the standard protocol following bolus administration of intravenous contrast. CONTRAST:  ISOVUE-300 IOPAMIDOL (ISOVUE-300) INJECTION 61% COMPARISON:  None. FINDINGS: Lower chest: Negative lung bases. No pericardial or pleural effusion. Small calcified right hilar lymph nodes compatible with granulomatous disease. Hepatobiliary: Decreased density in keeping with hepatic steatosis. Negative gallbladder. No biliary ductal enlargement. Pancreas: Negative. Spleen: Negative; multiple punctate calcified granulomas. Adrenals/Urinary Tract: Possible small right adrenal gland myelolipoma (series 2, image 26, benign), with otherwise normal adrenal glands. Bilateral renal enhancement and contrast excretion is normal. 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 The left ureters are nondilated despite extension through the  left retroperitoneal inflammation. Unremarkable urinary bladder. Stomach/Bowel: Negative rectum. Moderate to severe inflammation surrounding the sigmoid colon over a segment of about 12 cm with numerous diverticula in the region. Sigmoid wall thickening up to 2 cm. Along the superior and posterior aspect of the sigmoid segment there is a large oval gas and fluid containing collection encompassing 64 x 86 x 63 mm (AP by transverse by CC) with an estimated volume of 173 mL. See series 2, image 65, sagittal image 90. Inflammation surrounding this structure in tracking along the left pelvic side wall. No other extraluminal gas or fluid identified. Diverticulosis continues throughout the more proximal large bowel but with no other active inflammation identified. Normal appendix. Negative terminal ileum. No dilated small bowel. Negative stomach and duodenum. Vascular/Lymphatic: Major arterial structures in the abdomen and pelvis are patent. Portal venous system is patent. New line reactive appearing left retroperitoneal and mesenteric lymph nodes about the abnormal sigmoid colon. Reproductive: Evidence of prior vasectomy. Other: Evidence of prior right inguinal hernia repair. No pelvic free fluid. Musculoskeletal: Negative. IMPRESSION: 1. Complicated Sigmoid Diverticulitis with an 8.6 cm Pericolic Abscess in the left lower abdomen. Recommend hospital admission and consultation of both Surgery and Interventional Radiology for treatment planning and drainage. 2. Reactive appearing abdominal lymph nodes. No other complicating features identified. 3. Hepatic steatosis.  4. 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 5. Incidental old granulomatous disease. Electronically Signed: By: Odessa FlemingH  Hall M.D. On: 11/24/2016 12:18   Ct Image Guided Drainage By Percutaneous Catheter  Result Date: 11/24/2016 CLINICAL DATA:  Large left lower quadrant sigmoid diverticular peritoneal abscess requiring drainage. EXAM: CT  GUIDED CATHETER DRAINAGE OF PERITONEAL ABSCESS COMPARISON:  CT of the abdomen and pelvis performed earlier today. ANESTHESIA/SEDATION: 2.0 mg IV Versed 100 mcg IV Fentanyl Total Moderate Sedation Time:  18 minutes The patient's level of consciousness and physiologic status were continuously monitored during the procedure by Radiology nursing. PROCEDURE: The procedure, risks, benefits, and alternatives were explained to the patient. Questions regarding the procedure were encouraged and answered. The patient understands and consents to the procedure. A time out was performed prior to initiating the procedure. The left lower abdominal wall was prepped with chlorhexidine in a sterile fashion, and a sterile drape was applied covering the operative field. A sterile gown and sterile gloves were used for the procedure. Local anesthesia was provided with 1% Lidocaine. CT was performed in a supine position with the left side rolled up slightly. Under CT guidance, an 18 gauge trocar needle was advanced into a left lower quadrant diverticular abscess. A fluid sample was aspirated and sent for culture analysis. A guidewire was advanced into the collection. The percutaneous tract was dilated and a 12 French percutaneous drainage catheter advanced over the wire. Catheter positioning was confirmed by CT. The catheter was flushed and connected to a suction bulb. It was secured at the skin with a Prolene retention suture and StatLock device. COMPLICATIONS: None FINDINGS: Large left lower quadrant diverticular abscess is identified superior and posterior to the sigmoid colon. Aspiration yielded grossly purulent greenish white liquid. A 12 French drain was placed and is draining well after placement. IMPRESSION: CT-guided percutaneous catheter drainage of sigmoid diverticular abscess. A 12 French drainage catheter was placed and attached to suction bulb drainage. Drain output will be followed. A fluid sample was sent for culture  analysis. Electronically Signed   By: Irish LackGlenn  Yamagata M.D.   On: 11/24/2016 16:39        Scheduled Meds: . aspirin  81 mg Oral Daily  . atorvastatin  20 mg Oral q1800  . metoprolol succinate  50 mg Oral Daily  . sodium chloride flush  10 mL Intracatheter Q8H   Continuous Infusions: . sodium chloride 75 mL/hr at 11/25/16 0745  . piperacillin-tazobactam (ZOSYN)  IV 3.375 g (11/25/16 1445)     LOS: 1 day     Wilkes Potvin, MD, FACP, FHM. [facility] Pager [phone]  If 7PM-7AM, please contact night-coverage [email or web address] Password [email or web address] 11/25/2016, 5:50 PM

## 2016-11-25 NOTE — ED Provider Notes (Signed)
[facility] [address]  SURGICAL Provider Note   CSN: [id number] Arrival date & time: 11/24/16  1237     History   Chief Complaint Chief Complaint  Patient presents with  . Abdominal Pain  . Abscess    HPI Matthew Reid is a 56 y.o. male. Chief complaint is abdominal pain, abnormal CT  HPI:  Matthew Reid has a history of diverticular disease and previous diverticulitis. He developed familiar symptoms 10 days ago. He was placed on Cipro, and Flagyl as primary care physician. His pain was improving for the first week before starting to worsen a few days ago. He finished his antibiotics yesterday. He had an outpatient CT performed at [facility], or by his primary care physician today. This showed a large sigmoid. Diverticular abscess and he was referred here for definitive treatment.  No previous abnormal surgery. History of hypertension, hyperlipidemia, type 2 diabetes, on Glucophage.  Past Medical History:  Diagnosis Date  . Diabetes mellitus without complication (HCC)   . Diverticulitis   . Hypertension     Patient Active Problem List   Diagnosis Date Noted  . Intestinal diverticular abscess 11/24/2016  . Diverticulitis 11/24/2016  . Essential hypertension 11/24/2016  . Leukocytosis 11/24/2016  . HLD (hyperlipidemia) 11/24/2016  . Type 2 diabetes mellitus with complication, without long-term current use of insulin (HCC) 11/24/2016    Past Surgical History:  Procedure Laterality Date  . HERNIA REPAIR    . JOINT REPLACEMENT    . TONSILLECTOMY         Home Medications    Prior to Admission medications   Medication Sig Start Date End Date Taking? Authorizing Provider  acetaminophen (TYLENOL) 500 MG tablet Take 500 mg by mouth every 4 (four) hours as needed (for fever or pain).   Yes [provider]  aspirin EC 81 MG tablet Take 81 mg by mouth daily.   Yes [provider]  atorvastatin (LIPITOR) 20 MG tablet Take 20 mg by mouth daily.  11/23/16  Yes [provider]  metFORMIN (GLUCOPHAGE) 1000 MG tablet Take 1,000 mg by mouth 2 (two) times daily. 10/19/16  Yes [provider]  metoprolol succinate (TOPROL-XL) 50 MG 24 hr tablet Take 50 mg by mouth daily. 10/19/16  Yes [provider]    Family History Family History  Problem Relation Age of Onset  . Hypertension Father     Social History Social History  Substance Use Topics  . Smoking status: Not on file  . Smokeless tobacco: Not on file  . Alcohol use Not on file     Allergies   Glipizide   Review of Systems Review of Systems  Constitutional: Negative for appetite change, chills, diaphoresis, fatigue and fever.  HENT: Negative for mouth sores, sore throat and trouble swallowing.   Eyes: Negative for visual disturbance.  Respiratory: Negative for cough, chest tightness, shortness of breath and wheezing.   Cardiovascular: Negative for chest pain.  Gastrointestinal: Positive for abdominal pain. Negative for abdominal distention, diarrhea, nausea and vomiting.       Normal bowel habits. No vomiting. No diarrhea.  Endocrine: Negative for polydipsia, polyphagia and polyuria.  Genitourinary: Negative for dysuria, frequency and hematuria.       He describes some urgency and urinary frequency. No hematuria.  Musculoskeletal: Negative for gait problem.  Skin: Negative for color change, pallor and rash.  Neurological: Negative for dizziness, syncope, light-headedness and headaches.  Hematological: Does not bruise/bleed easily.  Psychiatric/Behavioral: Negative for behavioral problems  and confusion.     Physical Exam Updated Vital Signs BP 111/62 (BP Location: Right Arm)   Pulse 72   Temp 98 F (36.7 C) (Oral)   Resp 18   Ht 5\' 7"  (1.702 m)   Wt 108.6 kg (239 lb 6.4 oz)   SpO2 99%   BMI 37.50 kg/m   Physical Exam  Constitutional: He is oriented to person, place, and time. He appears well-developed and well-nourished. No  distress.  56 year old male who does not appear ill. He moves without apparent discomfort or difficulty.  HENT:  Head: Normocephalic.  Eyes: Pupils are equal, round, and reactive to light. Conjunctivae are normal. No scleral icterus.  Neck: Normal range of motion. Neck supple. No thyromegaly present.  Cardiovascular: Normal rate and regular rhythm.  Exam reveals no gallop and no friction rub.   No murmur heard. Pulmonary/Chest: Effort normal and breath sounds normal. No respiratory distress. He has no wheezes. He has no rales.  Abdominal: Soft. Bowel sounds are normal. He exhibits no distension. There is no tenderness. There is no rebound.  Abdomen soft. Tenderness to deep palpation left lower quadrant no peritoneal irritation on exam.  Musculoskeletal: Normal range of motion.  Neurological: He is alert and oriented to person, place, and time.  Skin: Skin is warm and dry. No rash noted.  Psychiatric: He has a normal mood and affect. His behavior is normal.     ED Treatments / Results  Labs (all labs ordered are listed, but only abnormal results are displayed) Labs Reviewed  COMPREHENSIVE METABOLIC PANEL - Abnormal; Notable for the following:       Result Value   Sodium 131 (*)    Chloride 97 (*)    CO2 20 (*)    Glucose, Bld 134 (*)    All other components within normal limits  URINALYSIS, ROUTINE W REFLEX MICROSCOPIC - Abnormal; Notable for the following:    Color, Urine STRAW (*)    Specific Gravity, Urine 1.044 (*)    Hgb urine dipstick SMALL (*)    Ketones, ur 20 (*)    All other components within normal limits  CBC - Abnormal; Notable for the following:    WBC 17.1 (*)    All other components within normal limits  CBC - Abnormal; Notable for the following:    WBC 11.1 (*)    All other components within normal limits  COMPREHENSIVE METABOLIC PANEL - Abnormal; Notable for the following:    Sodium 133 (*)    Chloride 97 (*)    Glucose, Bld 113 (*)    Calcium 8.7 (*)     Albumin 3.0 (*)    All other components within normal limits  URINE CULTURE  AEROBIC/ANAEROBIC CULTURE (SURGICAL/DEEP WOUND)  PROTIME-INR  I-STAT CG4 LACTIC ACID, ED    EKG  EKG Interpretation  Date/Time:  Tuesday November 24 2016 13:31:34 EDT Ventricular Rate:  104 PR Interval:    QRS Duration: 111 QT Interval:  369 QTC Calculation: 486 R Axis:   1 Text Interpretation:  Sinus tachycardia Inferior infarct, old No old tracing to compare Confirmed by Jackob, [place] ([id number]) on 11/24/2016 4:43:26 PM       Radiology Ct Abdomen Pelvis W Contrast  Addendum Date: 11/24/2016   ADDENDUM REPORT: 11/24/2016 12:33 ADDENDUM: Salient findings discussed by telephone with Dr. Mila Palmer on 11/24/2016 at 1219 hours. Electronically Signed   By: Odessa Fleming M.D.   On: 11/24/2016 12:33   Result Date: 11/24/2016 CLINICAL DATA:  56 year old male with persistent left lower quadrant abdominal pain, low-grade fever, nausea vomiting. Diagnosed with diverticulitis and started on antibiotics 11/13/2016, but symptoms persist. EXAM: CT ABDOMEN AND PELVIS WITH CONTRAST TECHNIQUE: Multidetector CT imaging of the abdomen and pelvis was performed using the standard protocol following bolus administration of intravenous contrast. CONTRAST:  125mL ISOVUE-300 IOPAMIDOL (ISOVUE-300) INJECTION 61% COMPARISON:  None. FINDINGS: Lower chest: Negative lung bases. No pericardial or pleural effusion. Small calcified right hilar lymph nodes compatible with granulomatous disease. Hepatobiliary: Decreased density in keeping with hepatic steatosis. Negative gallbladder. No biliary ductal enlargement. Pancreas: Negative. Spleen: Negative; multiple punctate calcified granulomas. Adrenals/Urinary Tract: Possible small right adrenal gland myelolipoma (series 2, image 26, benign), with otherwise normal adrenal glands. Bilateral renal enhancement and contrast excretion is normal. 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  The left ureters are nondilated despite extension through the left retroperitoneal inflammation. Unremarkable urinary bladder. Stomach/Bowel: Negative rectum. Moderate to severe inflammation surrounding the sigmoid colon over a segment of about 12 cm with numerous diverticula in the region. Sigmoid wall thickening up to 2 cm. Along the superior and posterior aspect of the sigmoid segment there is a large oval gas and fluid containing collection encompassing 64 x 86 x 63 mm (AP by transverse by CC) with an estimated volume of 173 mL. See series 2, image 65, sagittal image 90. Inflammation surrounding this structure in tracking along the left pelvic side wall. No other extraluminal gas or fluid identified. Diverticulosis continues throughout the more proximal large bowel but with no other active inflammation identified. Normal appendix. Negative terminal ileum. No dilated small bowel. Negative stomach and duodenum. Vascular/Lymphatic: Major arterial structures in the abdomen and pelvis are patent. Portal venous system is patent. New line reactive appearing left retroperitoneal and mesenteric lymph nodes about the abnormal sigmoid colon. Reproductive: Evidence of prior vasectomy. Other: Evidence of prior right inguinal hernia repair. No pelvic free fluid. Musculoskeletal: Negative. IMPRESSION: 1. Complicated Sigmoid Diverticulitis with an 8.6 cm Pericolic Abscess in the left lower abdomen. Recommend hospital admission and consultation of both Surgery and Interventional Radiology for treatment planning and drainage. 2. Reactive appearing abdominal lymph nodes. No other complicating features identified. 3. Hepatic steatosis. 4. 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 5. Incidental old granulomatous disease. Electronically Signed: By: Odessa FlemingH  Hall M.D. On: 11/24/2016 12:18   Ct Image Guided Drainage By Percutaneous Catheter  Result Date: 11/24/2016 CLINICAL DATA:  Large left lower quadrant sigmoid  diverticular peritoneal abscess requiring drainage. EXAM: CT GUIDED CATHETER DRAINAGE OF PERITONEAL ABSCESS COMPARISON:  CT of the abdomen and pelvis performed earlier today. ANESTHESIA/SEDATION: 2.0 mg IV Versed 100 mcg IV Fentanyl Total Moderate Sedation Time:  18 minutes The patient's level of consciousness and physiologic status were continuously monitored during the procedure by Radiology nursing. PROCEDURE: The procedure, risks, benefits, and alternatives were explained to the patient. Questions regarding the procedure were encouraged and answered. The patient understands and consents to the procedure. A time out was performed prior to initiating the procedure. The left lower abdominal wall was prepped with chlorhexidine in a sterile fashion, and a sterile drape was applied covering the operative field. A sterile gown and sterile gloves were used for the procedure. Local anesthesia was provided with 1% Lidocaine. CT was performed in a supine position with the left side rolled up slightly. Under CT guidance, an 18 gauge trocar needle was advanced into a left lower quadrant diverticular abscess. A fluid sample was aspirated and sent for  culture analysis. A guidewire was advanced into the collection. The percutaneous tract was dilated and a 12 French percutaneous drainage catheter advanced over the wire. Catheter positioning was confirmed by CT. The catheter was flushed and connected to a suction bulb. It was secured at the skin with a Prolene retention suture and StatLock device. COMPLICATIONS: None FINDINGS: Large left lower quadrant diverticular abscess is identified superior and posterior to the sigmoid colon. Aspiration yielded grossly purulent greenish white liquid. A 12 French drain was placed and is draining well after placement. IMPRESSION: CT-guided percutaneous catheter drainage of sigmoid diverticular abscess. A 12 French drainage catheter was placed and attached to suction bulb drainage. Drain output  will be followed. A fluid sample was sent for culture analysis. Electronically Signed   By: Irish Lack M.D.   On: 11/24/2016 16:39    Procedures Procedures (including critical care time)  Medications Ordered in ED Medications  0.9 %  sodium chloride infusion ( Intravenous New Bag/Given 11/25/16 0745)  acetaminophen (TYLENOL) tablet 650 mg (not administered)    Or  acetaminophen (TYLENOL) suppository 650 mg (not administered)  ondansetron (ZOFRAN) tablet 4 mg (not administered)    Or  ondansetron (ZOFRAN) injection 4 mg (not administered)  ketorolac (TORADOL) 30 MG/ML injection 30 mg (30 mg Intravenous Given 11/25/16 0745)  morphine 4 MG/ML injection 4 mg (4 mg Intravenous Given 11/25/16 0425)  hydrALAZINE (APRESOLINE) injection 5-10 mg (not administered)  midazolam (VERSED) 2 MG/2ML injection (not administered)  fentaNYL (SUBLIMAZE) 100 MCG/2ML injection (not administered)  lidocaine (XYLOCAINE) 1 % (with pres) injection (not administered)  atorvastatin (LIPITOR) tablet 20 mg (20 mg Oral Given 11/24/16 1840)  metoprolol succinate (TOPROL-XL) 24 hr tablet 50 mg (50 mg Oral Given 11/24/16 1840)  aspirin chewable tablet 81 mg (81 mg Oral Given 11/25/16 1105)  sodium chloride flush (NS) 0.9 % injection 10 mL (10 mLs Intracatheter Given 11/25/16 0745)  piperacillin-tazobactam (ZOSYN) IVPB 3.375 g (3.375 g Intravenous New Bag/Given 11/25/16 1445)  piperacillin-tazobactam (ZOSYN) IVPB 3.375 g (0 g Intravenous Stopped 11/24/16 1426)  0.9 %  sodium chloride infusion ( Intravenous Stopped 11/24/16 1454)  midazolam (VERSED) injection (1 mg Intravenous Given 11/24/16 1550)  fentaNYL (SUBLIMAZE) injection (50 mcg Intravenous Given 11/24/16 1550)  0.9 %  sodium chloride infusion (10 mL/hr Intravenous New Bag/Given 11/24/16 1543)     Initial Impression / Assessment and Plan / ED Course  I have reviewed the triage vital signs and the nursing notes.  Pertinent labs & imaging results that  were available during my care of the patient were reviewed by me and considered in my medical decision making (see chart for details).     Patient given Zosyn 3.375 g Zosyn. I discussed the case with interventional radiology, surgery, and hospitalist, Dr. Margot Ables. Patient is going directly from the emergency room to IR for CT-guided drainage of this diverticular abscess.  Final Clinical Impressions(s) / ED Diagnoses   Final diagnoses:  Colonic diverticular abscess    New Prescriptions Current Discharge Medication List       Rolland Porter, MD 11/25/16 1636

## 2016-11-25 NOTE — Progress Notes (Signed)
Referring Physician(s):  Dr. Angelena Form  Supervising Physician: Irish Lack  Patient Status:  [facility] - In-pt  Chief Complaint: Abdominal abscess, diverticulitis  Subjective: Resting comfortably.  Drain in place.   Allergies: Glipizide  Medications: Prior to Admission medications   Medication Sig Start Date End Date Taking? Authorizing Provider  acetaminophen (TYLENOL) 500 MG tablet Take 500 mg by mouth every 4 (four) hours as needed (for fever or pain).   Yes [provider]  aspirin EC 81 MG tablet Take 81 mg by mouth daily.   Yes [provider]  atorvastatin (LIPITOR) 20 MG tablet Take 20 mg by mouth daily. 11/23/16  Yes [provider]  metFORMIN (GLUCOPHAGE) 1000 MG tablet Take 1,000 mg by mouth 2 (two) times daily. 10/19/16  Yes [provider]  metoprolol succinate (TOPROL-XL) 50 MG 24 hr tablet Take 50 mg by mouth daily. 10/19/16  Yes [provider]     Vital Signs: BP (!) 109/57   Pulse 69   Temp 97.9 F (36.6 C)   Resp 18   Ht 5\' 7"  (1.702 m)   Wt 239 lb 6.4 oz (108.6 kg)   SpO2 96%   BMI 37.50 kg/m   Physical Exam  Constitutional: He appears well-developed.  Pulmonary/Chest: Effort normal. No respiratory distress.  Abdominal: Soft.  Drain in place in left lateral abdomen.  Site c/d/i.  Output dark, bloody.   85 mL overnight.   Nursing note and vitals reviewed.   Imaging: Ct Abdomen Pelvis W Contrast  Addendum Date: 11/24/2016   ADDENDUM REPORT: 11/24/2016 12:33 ADDENDUM: Salient findings discussed by telephone with Dr. Mila Palmer on 11/24/2016 at 1219 hours. Electronically Signed   By: Odessa Fleming M.D.   On: 11/24/2016 12:33   Result Date: 11/24/2016 CLINICAL DATA:  56 year old male with persistent left lower quadrant abdominal pain, low-grade fever, nausea vomiting. Diagnosed with diverticulitis and started on antibiotics 11/13/2016, but symptoms persist. EXAM: CT ABDOMEN AND PELVIS WITH CONTRAST  TECHNIQUE: Multidetector CT imaging of the abdomen and pelvis was performed using the standard protocol following bolus administration of intravenous contrast. CONTRAST:  ISOVUE-300 IOPAMIDOL (ISOVUE-300) INJECTION 61% COMPARISON:  None. FINDINGS: Lower chest: Negative lung bases. No pericardial or pleural effusion. Small calcified right hilar lymph nodes compatible with granulomatous disease. Hepatobiliary: Decreased density in keeping with hepatic steatosis. Negative gallbladder. No biliary ductal enlargement. Pancreas: Negative. Spleen: Negative; multiple punctate calcified granulomas. Adrenals/Urinary Tract: Possible small right adrenal gland myelolipoma (series 2, image 26, benign), with otherwise normal adrenal glands. Bilateral renal enhancement and contrast excretion is normal. 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 The left ureters are nondilated despite extension through the left retroperitoneal inflammation. Unremarkable urinary bladder. Stomach/Bowel: Negative rectum. Moderate to severe inflammation surrounding the sigmoid colon over a segment of about 12 cm with numerous diverticula in the region. Sigmoid wall thickening up to 2 cm. Along the superior and posterior aspect of the sigmoid segment there is a large oval gas and fluid containing collection encompassing 64 x 86 x 63 mm (AP by transverse by CC) with an estimated volume of 173 mL. See series 2, image 65, sagittal image 90. Inflammation surrounding this structure in tracking along the left pelvic side wall. No other extraluminal gas or fluid identified. Diverticulosis continues throughout the more proximal large bowel but with no other active inflammation identified. Normal appendix. Negative terminal ileum. No dilated small bowel. Negative stomach and duodenum. Vascular/Lymphatic: Major arterial structures in the abdomen  and pelvis are patent. Portal venous system is patent. New line reactive appearing left  retroperitoneal and mesenteric lymph nodes about the abnormal sigmoid colon. Reproductive: Evidence of prior vasectomy. Other: Evidence of prior right inguinal hernia repair. No pelvic free fluid. Musculoskeletal: Negative. IMPRESSION: 1. Complicated Sigmoid Diverticulitis with an 8.6 cm Pericolic Abscess in the left lower abdomen. Recommend hospital admission and consultation of both Surgery and Interventional Radiology for treatment planning and drainage. 2. Reactive appearing abdominal lymph nodes. No other complicating features identified. 3. Hepatic steatosis. 4. 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 5. Incidental old granulomatous disease. Electronically Signed: By: Odessa FlemingH  Hall M.D. On: 11/24/2016 12:18   Ct Image Guided Drainage By Percutaneous Catheter  Result Date: 11/24/2016 CLINICAL DATA:  Large left lower quadrant sigmoid diverticular peritoneal abscess requiring drainage. EXAM: CT GUIDED CATHETER DRAINAGE OF PERITONEAL ABSCESS COMPARISON:  CT of the abdomen and pelvis performed earlier today. ANESTHESIA/SEDATION: 2.0 mg IV Versed 100 mcg IV Fentanyl Total Moderate Sedation Time:  18 minutes The patient's level of consciousness and physiologic status were continuously monitored during the procedure by Radiology nursing. PROCEDURE: The procedure, risks, benefits, and alternatives were explained to the patient. Questions regarding the procedure were encouraged and answered. The patient understands and consents to the procedure. A time out was performed prior to initiating the procedure. The left lower abdominal wall was prepped with chlorhexidine in a sterile fashion, and a sterile drape was applied covering the operative field. A sterile gown and sterile gloves were used for the procedure. Local anesthesia was provided with 1% Lidocaine. CT was performed in a supine position with the left side rolled up slightly. Under CT guidance, an 18 gauge trocar needle was advanced into a  left lower quadrant diverticular abscess. A fluid sample was aspirated and sent for culture analysis. A guidewire was advanced into the collection. The percutaneous tract was dilated and a 12 French percutaneous drainage catheter advanced over the wire. Catheter positioning was confirmed by CT. The catheter was flushed and connected to a suction bulb. It was secured at the skin with a Prolene retention suture and StatLock device. COMPLICATIONS: None FINDINGS: Large left lower quadrant diverticular abscess is identified superior and posterior to the sigmoid colon. Aspiration yielded grossly purulent greenish white liquid. A 12 French drain was placed and is draining well after placement. IMPRESSION: CT-guided percutaneous catheter drainage of sigmoid diverticular abscess. A 12 French drainage catheter was placed and attached to suction bulb drainage. Drain output will be followed. A fluid sample was sent for culture analysis. Electronically Signed   By: Irish LackGlenn  Yamagata M.D.   On: 11/24/2016 16:39    Labs:  CBC:  Recent Labs  11/24/16 1330 11/25/16 0412  WBC 17.1* 11.1*  HGB 15.4 13.3  HCT 47.2 41.1  PLT 391 368    COAGS:  Recent Labs  11/24/16 1510  INR 1.06    BMP:  Recent Labs  11/24/16 1256 11/25/16 0412  NA 131* 133*  K 4.4 4.1  CL 97* 97*  CO2 20* 23  GLUCOSE 134* 113*  BUN 8 9  CALCIUM 8.9 8.7*  CREATININE 0.90 1.05  GFRNONAA >60 >60  GFRAA >60 >60    LIVER FUNCTION TESTS:  Recent Labs  11/24/16 1256 11/25/16 0412  BILITOT 0.5 0.6  AST 26 20  ALT 32 26  ALKPHOS 95 76  PROT 7.8 6.8  ALBUMIN 3.5 3.0*    Assessment and Plan: Diverticulitis  Patient with diverticulitis and abdominal abscess s/p  drain placement by Dr. Fredia Sorrow yesterday evening.  Drain currently in place with 85 mL output recorded overnight.  WBC 17.1  11.1 today.  Afebrile overnight.  Cultures pending, but prelim with gram neg rods and gram pos cocci in chains.  Continue routine drain  care.  IR to follow.    Electronically Signed: Hoyt Koch, PA 11/25/2016, 10:09 AM   I spent a total of 15 Minutes at the the patient's bedside AND on the patient's hospital floor or unit, greater than 50% of which was counseling/coordinating care for diverticulitis.

## 2016-11-25 NOTE — Progress Notes (Signed)
Nutrition Brief Note  Patient identified on the Malnutrition Screening Tool (MST) Report  Wt Readings from Last 15 Encounters:  11/24/16 239 lb 6.4 oz (108.6 kg)   Matthew Reid is a 56 y.o. male with medical history significant of diabetes, recurrent diverticulitis, hypertension. Patient reports multiple episodes of diverticulitis over the last several years. Current symptoms started approximately 2 weeks prior to admission with patient's typical diverticular symptoms of abdominal pain and subjective fevers.patient localized to the left lower quadrant. Described as dull and intermittently stabbing. Associated with nausea but no emesis. Started on ciprofloxacin and Flagyl approximately 10 days prior to admission without improvement. Patient had an outpatient CT scan ordered by his primary care physician and was called after the results came back showing a large diverticular abscess.   Pt admitted with large diverticular abscess.   10/16- s/p CT guided drainage of diverticular abscess  Spoke with pt, who reports intentional wt loss over the past 2-3 months, due to lifestyle changes. Pt reports he has been following "The Next 56 Day Diet" to help manage diverticulitis, DM, and assist with weight loss. Pt reports that this diet focuses on "clean eating" and has been consuming a lot of ancient grains, fruits, and vegetables. Over the past 2 weeks, pt reports decreased appetite related to abdominal pain. Pt has been consuming soft foods such as chicken noodle soup and scrambled eggs during this time. Pt also endorses a 30# wt loss over the past 2 weeks, which is not consistent with wt hx. Per CareEverywhere, pt has experienced a 16# (6.2%) wt loss over the past 17 days.  Pt tolerated clear liquid diet and is anxious to try full liquids for lunch. Discussed potential for diet advancement. Also reviewed menu choices and which foods are allowed on a full liquid diet.   Nutrition-Focused physical exam  completed. Findings are no fat depletion, *no muscle depletion, and no edema.   Body mass index is 37.5 kg/m. Patient meets criteria for obesity, class II based on current BMI.   Current diet order is full liquid, patient is consuming approximately n/a% of meals at this time. Labs and medications reviewed.   No nutrition interventions warranted at this time. If nutrition issues arise, please consult RD.   Matthew Reid, RD, LDN, CDE Pager: [phone] After hours Pager: [phone]

## 2016-11-26 ENCOUNTER — Encounter (HOSPITAL_COMMUNITY): Payer: Self-pay | Admitting: *Deleted

## 2016-11-26 LAB — BASIC METABOLIC PANEL
Anion gap: 11 (ref 5–15)
BUN: 5 mg/dL — AB (ref 6–20)
CHLORIDE: 100 mmol/L — AB (ref 101–111)
CO2: 25 mmol/L (ref 22–32)
CREATININE: 0.89 mg/dL (ref 0.61–1.24)
Calcium: 8.6 mg/dL — ABNORMAL LOW (ref 8.9–10.3)
GFR calc Af Amer: >60 mL/min (ref >60)
Glucose, Bld: 108 mg/dL — ABNORMAL HIGH (ref 65–99)
Potassium: 4 mmol/L (ref 3.5–5.1)
SODIUM: 136 mmol/L (ref 135–145)

## 2016-11-26 LAB — CBC
HCT: 39.5 % (ref 39.0–52.0)
HEMOGLOBIN: 12.5 g/dL — AB (ref 13.0–17.0)
MCH: 29 pg (ref 26.0–34.0)
MCHC: 31.6 g/dL (ref 30.0–36.0)
MCV: 91.6 fL (ref 78.0–100.0)
PLATELETS: 385 10*3/uL (ref 150–400)
RBC: 4.31 MIL/uL (ref 4.22–5.81)
RDW: 13.5 % (ref 11.5–15.5)
WBC: 9.7 10*3/uL (ref 4.0–10.5)

## 2016-11-26 LAB — GLUCOSE, CAPILLARY
GLUCOSE-CAPILLARY: 104 mg/dL — AB (ref 65–99)
Glucose-Capillary: 112 mg/dL — ABNORMAL HIGH (ref 65–99)
Glucose-Capillary: 138 mg/dL — ABNORMAL HIGH (ref 65–99)
Glucose-Capillary: 138 mg/dL — ABNORMAL HIGH (ref 65–99)

## 2016-11-26 LAB — HIV ANTIBODY (ROUTINE TESTING W REFLEX): HIV Screen 4th Generation wRfx: NONREACTIVE

## 2016-11-26 MED ORDER — OXYCODONE HCL 5 MG PO TABS
5.0000 mg | ORAL_TABLET | ORAL | Status: DC | PRN
  Administered 2016-11-27: 5 mg via ORAL
  Filled 2016-11-26: qty 1

## 2016-11-26 MED ORDER — MORPHINE SULFATE (PF) 4 MG/ML IV SOLN
2.0000 mg | INTRAVENOUS | Status: DC | PRN
  Administered 2016-11-26 (×2): 2 mg via INTRAVENOUS
  Filled 2016-11-26 (×3): qty 1

## 2016-11-26 NOTE — Progress Notes (Signed)
Referring Physician(s):  Dr. Angelena Form  Supervising Physician: Malachy Moan  Patient Status:  Matthew Reid - In-pt  Chief Complaint: Abdominal abscess, diverticulitis  Subjective: Resting comfortably.  Drain in place. Says he may go home soon.   Allergies: Glipizide  Medications: Prior to Admission medications   Medication Sig Start Date End Date Taking? Authorizing Provider  acetaminophen (TYLENOL) 500 MG tablet Take 500 mg by mouth every 4 (four) hours as needed (for fever or pain).   Yes [provider]  aspirin EC 81 MG tablet Take 81 mg by mouth daily.   Yes [provider]  atorvastatin (LIPITOR) 20 MG tablet Take 20 mg by mouth daily. 11/23/16  Yes [provider]  metFORMIN (GLUCOPHAGE) 1000 MG tablet Take 1,000 mg by mouth 2 (two) times daily. 10/19/16  Yes [provider]  metoprolol succinate (TOPROL-XL) 50 MG 24 hr tablet Take 50 mg by mouth daily. 10/19/16  Yes [provider]     Vital Signs: BP 116/67 (BP Location: Right Arm)   Pulse 75   Temp 98.6 F (37 C) (Oral)   Resp 16   Ht 5\' 7"  (1.702 m)   Wt 239 lb 6.4 oz (108.6 kg)   SpO2 96%   BMI 37.50 kg/m   Physical Exam  Constitutional: He appears well-developed.  Pulmonary/Chest: Effort normal. No respiratory distress.  Abdominal: Soft.  Drain in place in left lateral abdomen.  Site c/d/i.  Serosanguinous output.   Nursing note and vitals reviewed.   Imaging: Ct Abdomen Pelvis W Contrast  Addendum Date: 11/24/2016   ADDENDUM REPORT: 11/24/2016 12:33 ADDENDUM: Salient findings discussed by telephone with Dr. Mila Palmer on 11/24/2016 at 1219 hours. Electronically Signed   By: Odessa Fleming M.D.   On: 11/24/2016 12:33   Result Date: 11/24/2016 CLINICAL DATA:  56 year old male with persistent left lower quadrant abdominal pain, low-grade fever, nausea vomiting. Diagnosed with diverticulitis and started on antibiotics 11/13/2016, but symptoms persist. EXAM: CT  ABDOMEN AND PELVIS WITH CONTRAST TECHNIQUE: Multidetector CT imaging of the abdomen and pelvis was performed using the standard protocol following bolus administration of intravenous contrast. CONTRAST:  ISOVUE-300 IOPAMIDOL (ISOVUE-300) INJECTION 61% COMPARISON:  None. FINDINGS: Lower chest: Negative lung bases. No pericardial or pleural effusion. Small calcified right hilar lymph nodes compatible with granulomatous disease. Hepatobiliary: Decreased density in keeping with hepatic steatosis. Negative gallbladder. No biliary ductal enlargement. Pancreas: Negative. Spleen: Negative; multiple punctate calcified granulomas. Adrenals/Urinary Tract: Possible small right adrenal gland myelolipoma (series 2, image 26, benign), with otherwise normal adrenal glands. Bilateral renal enhancement and contrast excretion is normal. 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 The left ureters are nondilated despite extension through the left retroperitoneal inflammation. Unremarkable urinary bladder. Stomach/Bowel: Negative rectum. Moderate to severe inflammation surrounding the sigmoid colon over a segment of about 12 cm with numerous diverticula in the region. Sigmoid wall thickening up to 2 cm. Along the superior and posterior aspect of the sigmoid segment there is a large oval gas and fluid containing collection encompassing 64 x 86 x 63 mm (AP by transverse by CC) with an estimated volume of 173 mL. See series 2, image 65, sagittal image 90. Inflammation surrounding this structure in tracking along the left pelvic side wall. No other extraluminal gas or fluid identified. Diverticulosis continues throughout the more proximal large bowel but with no other active inflammation identified. Normal appendix. Negative terminal ileum. No dilated small bowel. Negative stomach and duodenum. Vascular/Lymphatic: Major arterial  structures in the abdomen and pelvis are patent. Portal venous system is patent. New  line reactive appearing left retroperitoneal and mesenteric lymph nodes about the abnormal sigmoid colon. Reproductive: Evidence of prior vasectomy. Other: Evidence of prior right inguinal hernia repair. No pelvic free fluid. Musculoskeletal: Negative. IMPRESSION: 1. Complicated Sigmoid Diverticulitis with an 8.6 cm Pericolic Abscess in the left lower abdomen. Recommend [facility] admission and consultation of both Surgery and Interventional Radiology for treatment planning and drainage. 2. Reactive appearing abdominal lymph nodes. No other complicating features identified. 3. Hepatic steatosis. 4. 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 5. Incidental old granulomatous disease. Electronically Signed: By: Odessa FlemingH  Hall M.D. On: 11/24/2016 12:18   Ct Image Guided Drainage By Percutaneous Catheter  Result Date: 11/24/2016 CLINICAL DATA:  Large left lower quadrant sigmoid diverticular peritoneal abscess requiring drainage. EXAM: CT GUIDED CATHETER DRAINAGE OF PERITONEAL ABSCESS COMPARISON:  CT of the abdomen and pelvis performed earlier today. ANESTHESIA/SEDATION: 2.0 mg IV Versed 100 mcg IV Fentanyl Total Moderate Sedation Time:  18 minutes The patient's level of consciousness and physiologic status were continuously monitored during the procedure by Radiology nursing. PROCEDURE: The procedure, risks, benefits, and alternatives were explained to the patient. Questions regarding the procedure were encouraged and answered. The patient understands and consents to the procedure. A time out was performed prior to initiating the procedure. The left lower abdominal wall was prepped with chlorhexidine in a sterile fashion, and a sterile drape was applied covering the operative field. A sterile gown and sterile gloves were used for the procedure. Local anesthesia was provided with 1% Lidocaine. CT was performed in a supine position with the left side rolled up slightly. Under CT guidance, an 18 gauge trocar  needle was advanced into a left lower quadrant diverticular abscess. A fluid sample was aspirated and sent for culture analysis. A guidewire was advanced into the collection. The percutaneous tract was dilated and a 12 French percutaneous drainage catheter advanced over the wire. Catheter positioning was confirmed by CT. The catheter was flushed and connected to a suction bulb. It was secured at the skin with a Prolene retention suture and StatLock device. COMPLICATIONS: None FINDINGS: Large left lower quadrant diverticular abscess is identified superior and posterior to the sigmoid colon. Aspiration yielded grossly purulent greenish white liquid. A 12 French drain was placed and is draining well after placement. IMPRESSION: CT-guided percutaneous catheter drainage of sigmoid diverticular abscess. A 12 French drainage catheter was placed and attached to suction bulb drainage. Drain output will be followed. A fluid sample was sent for culture analysis. Electronically Signed   By: Irish LackGlenn  Yamagata M.D.   On: 11/24/2016 16:39    Labs:  CBC:  Recent Labs  11/24/16 1330 11/25/16 0412 11/26/16 0522  WBC 17.1* 11.1* 9.7  HGB 15.4 13.3 12.5*  HCT 47.2 41.1 39.5  PLT 391 368 385    COAGS:  Recent Labs  11/24/16 1510  INR 1.06    BMP:  Recent Labs  11/24/16 1256 11/25/16 0412 11/26/16 0522  NA 131* 133* 136  K 4.4 4.1 4.0  CL 97* 97* 100*  CO2 20* 23 25  GLUCOSE 134* 113* 108*  BUN 8 9 5*  CALCIUM 8.9 8.7* 8.6*  CREATININE 0.90 1.05 0.89  GFRNONAA >60 >60 >60  GFRAA >60 >60 >60    LIVER FUNCTION TESTS:  Recent Labs  11/24/16 1256 11/25/16 0412  BILITOT 0.5 0.6  AST 26 20  ALT 32 26  ALKPHOS 95 76  PROT 7.8 6.8  ALBUMIN 3.5 3.0*    Assessment and Plan: Diverticulitis with abscess Patient with diverticulitis and abdominal abscess s/p drain placement 10/16 Drain currently in place with decreased output overnight.  WBC WNL.  Cultures pending Continue routine drain  care.  IR to follow. Will place order for schedulers to contact patient with date and time of appointment in [facility] clinic after discharge.    Electronically Signed: Hoyt Koch, PA 11/26/2016, 1:52 PM   I spent a total of 15 Minutes at the the patient's bedside AND on the patient's [facility] floor or unit, greater than 50% of which was counseling/coordinating care for diverticulitis.

## 2016-11-26 NOTE — Progress Notes (Signed)
[facility] Progress Note     Assessment/Plan Sigmoid diverticulitis with abscess - S/p IR drainage 10/16 - cx pending - WBC 9.7 from 11.1 yesterday, afebrile - More improvement in pain, well controlled with IV morphine      - Will add PO oxycodone and keep morphine for break through pain  - Drain with serosanguinous fluid, little output today - Continue IV abx and full liquids since he is still having LLQ pain with deep palpation  FEN: Full liquids VTE: SCD's ID: IV Zosyn (10/16>>)    LOS: 2 days    Subjective: CC: diverticulitis  - Abdominal pain improving but mild LLQ discomfort near drain placement with movement  - Denies N/V, fevers - Passing flatus - No BM since 2 days ago    Objective: Vital signs in last 24 hours: Temp:  [98 F (36.7 C)-98.7 F (37.1 C)] 98.2 F (36.8 C) (10/18 0510) Pulse Rate:  [67-72] 69 (10/18 0510) Resp:  [17-18] 17 (10/18 0510) BP: (110-111)/(59-62) 110/61 (10/18 0510) SpO2:  [96 %-99 %] 96 % (10/18 0510) Last BM Date: 11/24/16  Intake/Output from previous day: 10/17 0701 - 10/18 0700 In: 1230 [P.O.:530; I.V.:580; IV Piggyback:100] Out: 48 [Drains:48] Intake/Output this shift: No intake/output data recorded.  PE: Gen:  Alert, NAD, pleasant, cooperative Card:  RRR, 2 + radial pulses bilaterally Pulm:  CTA, no W/R/R, effort normal Abd: Soft, ND, +BS, drain with minimal sanguinous drainage, moderate TTP LLQ with deep palpation Skin: no rashes noted, warm and dry   Anti-infectives: Anti-infectives    Start     Dose/Rate Route Frequency Ordered Stop   11/24/16 2230  piperacillin-tazobactam (ZOSYN) IVPB 3.375 g     3.375 g 12.5 mL/hr over 240 Minutes Intravenous Every 8 hours 11/24/16 2205     11/24/16 1330  piperacillin-tazobactam (ZOSYN) IVPB 3.375 g     3.375 g 100 mL/hr over 30 Minutes Intravenous  Once 11/24/16 1327 11/24/16 1426      Lab Results:   Recent Labs  11/25/16 0412 11/26/16 0522  WBC  11.1* 9.7  HGB 13.3 12.5*  HCT 41.1 39.5  PLT 368 385   BMET  Recent Labs  11/25/16 0412 11/26/16 0522  NA 133* 136  K 4.1 4.0  CL 97* 100*  CO2 23 25  GLUCOSE 113* 108*  BUN 9 5*  CREATININE 1.05 0.89  CALCIUM 8.7* 8.6*   PT/INR  Recent Labs  11/24/16 1510  LABPROT 13.7  INR 1.06   CMP     Component Value Date/Time   NA 136 11/26/2016 0522   K 4.0 11/26/2016 0522   CL 100 (L) 11/26/2016 0522   CO2 25 11/26/2016 0522   GLUCOSE 108 (H) 11/26/2016 0522   BUN 5 (L) 11/26/2016 0522   CREATININE 0.89 11/26/2016 0522   CALCIUM 8.6 (L) 11/26/2016 0522   PROT 6.8 11/25/2016 0412   ALBUMIN 3.0 (L) 11/25/2016 0412   AST 20 11/25/2016 0412   ALT 26 11/25/2016 0412   ALKPHOS 76 11/25/2016 0412   BILITOT 0.6 11/25/2016 0412   GFRNONAA >60 11/26/2016 0522   GFRAA >60 11/26/2016 0522   Lipase  No results found for: LIPASE  Studies/Results: Ct Abdomen Pelvis W Contrast  Addendum Date: 11/24/2016   ADDENDUM REPORT: 11/24/2016 12:33 ADDENDUM: Salient findings discussed by telephone with Dr. Mila PalmerSHARON WOLTERS on 11/24/2016 at 1219 hours. Electronically Signed   By: Odessa FlemingH  Hall M.D.   On: 11/24/2016 12:33   Result Date: 11/24/2016 CLINICAL DATA:  56 year old  male with persistent left lower quadrant abdominal pain, low-grade fever, nausea vomiting. Diagnosed with diverticulitis and started on antibiotics 11/13/2016, but symptoms persist. EXAM: CT ABDOMEN AND PELVIS WITH CONTRAST TECHNIQUE: Multidetector CT imaging of the abdomen and pelvis was performed using the standard protocol following bolus administration of intravenous contrast. CONTRAST:  ISOVUE-300 IOPAMIDOL (ISOVUE-300) INJECTION 61% COMPARISON:  None. FINDINGS: Lower chest: Negative lung bases. No pericardial or pleural effusion. Small calcified right hilar lymph nodes compatible with granulomatous disease. Hepatobiliary: Decreased density in keeping with hepatic steatosis. Negative gallbladder. No biliary ductal  enlargement. Pancreas: Negative. Spleen: Negative; multiple punctate calcified granulomas. Adrenals/Urinary Tract: Possible small right adrenal gland myelolipoma (series 2, image 26, benign), with otherwise normal adrenal glands. Bilateral renal enhancement and contrast excretion is normal. 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 The left ureters are nondilated despite extension through the left retroperitoneal inflammation. Unremarkable urinary bladder. Stomach/Bowel: Negative rectum. Moderate to severe inflammation surrounding the sigmoid colon over a segment of about 12 cm with numerous diverticula in the region. Sigmoid wall thickening up to 2 cm. Along the superior and posterior aspect of the sigmoid segment there is a large oval gas and fluid containing collection encompassing 64 x 86 x 63 mm (AP by transverse by CC) with an estimated volume of 173 mL. See series 2, image 65, sagittal image 90. Inflammation surrounding this structure in tracking along the left pelvic side wall. No other extraluminal gas or fluid identified. Diverticulosis continues throughout the more proximal large bowel but with no other active inflammation identified. Normal appendix. Negative terminal ileum. No dilated small bowel. Negative stomach and duodenum. Vascular/Lymphatic: Major arterial structures in the abdomen and pelvis are patent. Portal venous system is patent. New line reactive appearing left retroperitoneal and mesenteric lymph nodes about the abnormal sigmoid colon. Reproductive: Evidence of prior vasectomy. Other: Evidence of prior right inguinal hernia repair. No pelvic free fluid. Musculoskeletal: Negative. IMPRESSION: 1. Complicated Sigmoid Diverticulitis with an 8.6 cm Pericolic Abscess in the left lower abdomen. Recommend hospital admission and consultation of both Surgery and Interventional Radiology for treatment planning and drainage. 2. Reactive appearing abdominal lymph nodes. No other  complicating features identified. 3. Hepatic steatosis. 4. 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 5. Incidental old granulomatous disease. Electronically Signed: By: Odessa Fleming M.D. On: 11/24/2016 12:18   Ct Image Guided Drainage By Percutaneous Catheter  Result Date: 11/24/2016 CLINICAL DATA:  Large left lower quadrant sigmoid diverticular peritoneal abscess requiring drainage. EXAM: CT GUIDED CATHETER DRAINAGE OF PERITONEAL ABSCESS COMPARISON:  CT of the abdomen and pelvis performed earlier today. ANESTHESIA/SEDATION: 2.0 mg IV Versed 100 mcg IV Fentanyl Total Moderate Sedation Time:  18 minutes The patient's level of consciousness and physiologic status were continuously monitored during the procedure by Radiology nursing. PROCEDURE: The procedure, risks, benefits, and alternatives were explained to the patient. Questions regarding the procedure were encouraged and answered. The patient understands and consents to the procedure. A time out was performed prior to initiating the procedure. The left lower abdominal wall was prepped with chlorhexidine in a sterile fashion, and a sterile drape was applied covering the operative field. A sterile gown and sterile gloves were used for the procedure. Local anesthesia was provided with 1% Lidocaine. CT was performed in a supine position with the left side rolled up slightly. Under CT guidance, an 18 gauge trocar needle was advanced into a left lower quadrant diverticular abscess. A fluid sample was aspirated and sent for culture  analysis. A guidewire was advanced into the collection. The percutaneous tract was dilated and a 12 French percutaneous drainage catheter advanced over the wire. Catheter positioning was confirmed by CT. The catheter was flushed and connected to a suction bulb. It was secured at the skin with a Prolene retention suture and StatLock device. COMPLICATIONS: None FINDINGS: Large left lower quadrant diverticular abscess is  identified superior and posterior to the sigmoid colon. Aspiration yielded grossly purulent greenish white liquid. A 12 French drain was placed and is draining well after placement. IMPRESSION: CT-guided percutaneous catheter drainage of sigmoid diverticular abscess. A 12 French drainage catheter was placed and attached to suction bulb drainage. Drain output will be followed. A fluid sample was sent for culture analysis. Electronically Signed   By: Irish Lack M.D.   On: 11/24/2016 16:39    Richy Spradley [facility] 11/26/2016, 9:23 AM

## 2016-11-26 NOTE — Progress Notes (Signed)
PROGRESS NOTE   Matthew Reid  ZOX:[id number]    DOB: 03-24-1960    DOA: 11/24/2016  PCP: Farris Has, MD   I have briefly reviewed patients previous medical records in [facility].  Brief Narrative:  56 year old male with PMH of DM, recurrent diverticulitis, HTN, presented with approximately 2 weeks history of abdominal pain and subjective fevers, no significant improvement after 10 days of ciprofloxacin and Flagyl, outpatient CT abdomen confirmed large diverticular abscess. IR placed a percutaneous drain. Broad-spectrum IV antibiotics. CCS consulted.   Assessment & Plan:   Active Problems:   Intestinal diverticular abscess   Diverticulitis   Essential hypertension   Leukocytosis   HLD (hyperlipidemia)   Type 2 diabetes mellitus with complication, without long-term current use of insulin (HCC)   Sigmoid diverticulitis with abscess - Status post PC drain placed by IR on 10/16. Preliminary abscess culture shows abundant gram-negative rods. Continue IV Zosyn. Clinically improved. IR and CCS following.  - Continues to improve. As per chart review, will likely DC home with drain and follow-up in drain clinic by IR. As per patient, surgery for elective consideration as outpatient. Advancing diet and possible discharge home 10/19.  Essential hypertension Controlled on metoprolol.  Type II DM Holding metformin. Check CBG. SSI for needed.  Hyperlipidemia Continue atorvastatin  DVT prophylaxis: SCDs Code Status: Full Family Communication: None at bedside Disposition: DC home when medically improved, possibly 10/19   Consultants:  Interventional radiology General surgery   Procedures:  Percutaneous drain  Antimicrobials:  IV Zosyn    Subjective: Left lower quadrant abdominal pain had improved until he ambulated in the halls today and then noticed slightly more pain. Decreased drainage through the tube. Tolerating diet. Passing flatus. No BM.  ROS: No chest pain or  dyspnea.  Objective:  Vitals:   11/24/16 2019 11/26/16 0510 11/26/16 0938 11/26/16 1300  BP:  110/61 114/60 116/67  Pulse:  69 77 75  Resp:  17 18 16   Temp:  98.2 F (36.8 C) 98 F (36.7 C) 98.6 F (37 C)  TempSrc:  Oral Oral Oral  SpO2:  96% 95% 96%  Weight: 108.6 kg (239 lb 6.4 oz)     Height: 5\' 7"  (1.702 m)       Examination:  General exam: Young male, moderately built and obese seen ambulating comfortably in the hall this morning. Respiratory system: Clear to auscultation. Respiratory effort normal. Stable. Cardiovascular system: S1 & S2 heard, RRR. No JVD, murmurs, rubs, gallops or clicks. No pedal edema. Stable. Gastrointestinal system: Abdomen is mildly tender in left lower quadrant without peritoneal signs. LLQ PC drain with minimal blood stained fluid. No organomegaly or masses felt. Normal bowel sounds heard. Central nervous system: Alert and oriented. No focal neurological deficits. Stable Extremities: Symmetric 5 x 5 power. Skin: No rashes, lesions or ulcers Psychiatry: Judgement and insight appear normal. Mood & affect appropriate.     Data Reviewed: I have personally reviewed following labs and imaging studies  CBC:  Recent Labs Lab 11/24/16 1330 11/25/16 0412 11/26/16 0522  WBC 17.1* 11.1* 9.7  HGB 15.4 13.3 12.5*  HCT 47.2 41.1 39.5  MCV 90.1 90.9 91.6  PLT 391 368 385   Basic Metabolic Panel:  Recent Labs Lab 11/24/16 1256 11/25/16 0412 11/26/16 0522  NA 131* 133* 136  K 4.4 4.1 4.0  CL 97* 97* 100*  CO2 20* 23 25  GLUCOSE 134* 113* 108*  BUN 8 9 5*  CREATININE 0.90 1.05 0.89  CALCIUM 8.9  8.7* 8.6*   Liver Function Tests:  Recent Labs Lab 11/24/16 1256 11/25/16 0412  AST 26 20  ALT 32 26  ALKPHOS 95 76  BILITOT 0.5 0.6  PROT 7.8 6.8  ALBUMIN 3.5 3.0*   Coagulation Profile:  Recent Labs Lab 11/24/16 1510  INR 1.06    Recent Results (from the past 240 hour(s))  Urine Culture     Status: None   Collection Time:  11/24/16  1:41 PM  Result Value Ref Range Status   Specimen Description URINE, CLEAN CATCH  Final   Special Requests NONE  Final   Culture NO GROWTH  Final   Report Status 11/25/2016 FINAL  Final  Aerobic/Anaerobic Culture (surgical/deep wound)     Status: None (Preliminary result)   Collection Time: 11/24/16  4:11 PM  Result Value Ref Range Status   Specimen Description ABSCESS  Final   Special Requests ASPIRATED FROM DIVERTICULAR ABSCESS  Final   Gram Stain   Final    ABUNDANT WBC PRESENT, PREDOMINANTLY PMN MODERATE GRAM POSITIVE COCCI IN CHAINS FEW GRAM NEGATIVE RODS    Culture   Final    ABUNDANT GRAM NEGATIVE RODS NO ANAEROBES ISOLATED; CULTURE IN PROGRESS FOR 5 DAYS    Report Status PENDING  Incomplete         Radiology Studies: No results found.      Scheduled Meds: . aspirin  81 mg Oral Daily  . atorvastatin  20 mg Oral q1800  . insulin aspart  0-9 Units Subcutaneous TID WC  . metoprolol succinate  50 mg Oral Daily  . sodium chloride flush  10 mL Intracatheter Q8H   Continuous Infusions: . piperacillin-tazobactam (ZOSYN)  IV 3.375 g (11/26/16 1243)     LOS: 2 days     Ivone Licht, MD, FACP, FHM. [facility] Pager [phone]  If 7PM-7AM, please contact night-coverage [email or web address] Password [email or web address] 11/26/2016, 4:43 PM

## 2016-11-27 LAB — GLUCOSE, CAPILLARY: GLUCOSE-CAPILLARY: 124 mg/dL — AB (ref 65–99)

## 2016-11-27 MED ORDER — SODIUM CHLORIDE 0.9% FLUSH
10.0000 mL | Freq: Three times a day (TID) | INTRAVENOUS | 0 refills | Status: DC
Start: 2016-11-27 — End: 2016-11-27

## 2016-11-27 MED ORDER — CEFPODOXIME PROXETIL 200 MG PO TABS: 400.0000 mg | ORAL_TABLET | Freq: Two times a day (BID) | ORAL | 0 refills | Status: AC

## 2016-11-27 MED ORDER — METRONIDAZOLE 500 MG PO TABS: 500.0000 mg | ORAL_TABLET | Freq: Three times a day (TID) | ORAL | 0 refills | Status: AC

## 2016-11-27 MED ORDER — SODIUM CHLORIDE 0.9% FLUSH: INTRAVENOUS | 0 refills | Status: AC

## 2016-11-27 NOTE — Care Management Note (Signed)
Case Management Note  Patient Details  Name: Matthew Reid MRN: [id number] Date of Birth: 31-Jul-1960  Subjective/Objective:                    Action/Plan:  Consult for : Drain care, nurse stated a consult was needed for supplies for drain care? Questions please page me [phone]. Thanks Matthew MarlinJessica Focht, [place]  Bedside nurse will teach patient and wife how to flush / care for drain.   Spoke with Matthew BumpsJessica she will provide prescription for flushes  Expected Discharge Date:                  Expected Discharge Plan:  Home/Self Care  In-House Referral:     Discharge planning Services  CM Consult  Post Acute Care Choice:    Choice offered to:     DME Arranged:    DME Agency:     HH Arranged:    HH Agency:     Status of Service:  Completed, signed off  If discussed at [employer] Length of Stay Meetings, dates discussed:    Additional Comments:  Kingsley PlanWile, Ernst Cumpston Marie, RN 11/27/2016, 10:45 AM

## 2016-11-27 NOTE — Final Consult Note (Signed)
Consultant Final Sign-Off Note    Assessment/Final recommendations  Matthew ShileyDavid Reid is a 56 y.o. male followed by me for diverticulitis, S/P IR drain placement, 10/16   Wound care (if applicable): per IR for drain site   Diet at discharge: one week of soft diet (in discharge instructions) then regular diet    Activity at discharge: per primary team   Follow-up appointment:  2-3 weeks with Dr. Cliffton AstersWhite   Pending results:  Unresulted Labs    Start     Ordered   Unscheduled  HIV antibody (Routine Testing)  With next blood draw,   R     11/24/16 1445       Medication recommendations: 7days of Cipro/flagyl   Other recommendations:    Thank you for allowing us to participate in the care of your patient!  Please consult us again if you have further needs for your patient.  Jerre SimonJessica L Billie Trager 11/27/2016 9:39 AM    Subjective   CC: abdominal pain  Pain is improved. No nausea or vomiting. Tolerating diet. No fever.   Objective  Vital signs in last 24 hours: Temp:  [98.6 F (37 C)] 98.6 F (37 C) (10/19 0630) Pulse Rate:  [63-78] 63 (10/19 0630) Resp:  [16-18] 16 (10/19 0630) BP: (116-126)/(67-70) 121/69 (10/19 0630) SpO2:  [96 %-98 %] 96 % (10/19 0630)  Physical Exam  Constitutional: He appears well-developed and well-nourished. No distress.  Cardiovascular: Normal rate, regular rhythm and normal heart sounds.   No murmur heard. Pulmonary/Chest: Effort normal and breath sounds normal. No respiratory distress. He has no wheezes. He has no rhonchi.  Abdominal: Soft. Normal appearance and bowel sounds are normal. Tenderness: mild TTP. There is no guarding. No hernia.  Obese abdomen Drain site LLQ with dressing intact, drain with minimal serosanguinous drainage  Skin: Skin is warm and dry. No rash noted. He is not diaphoretic.  Psychiatric: He has a normal mood and affect. Judgment normal.  Nursing note and vitals reviewed.    Pertinent labs and Studies:  Recent Labs  11/24/16 1330 11/25/16 0412 11/26/16 0522  WBC 17.1* 11.1* 9.7  HGB 15.4 13.3 12.5*  HCT 47.2 41.1 39.5   BMET  Recent Labs  11/25/16 0412 11/26/16 0522  NA 133* 136  K 4.1 4.0  CL 97* 100*  CO2 23 25  GLUCOSE 113* 108*  BUN 9 5*  CREATININE 1.05 0.89  CALCIUM 8.7* 8.6*   No results for input(s): LABURIN in the last 72 hours. Results for orders placed or performed during the hospital encounter of 11/24/16  Urine Culture     Status: None   Collection Time: 11/24/16  1:41 PM  Result Value Ref Range Status   Specimen Description URINE, CLEAN CATCH  Final   Special Requests NONE  Final   Culture NO GROWTH  Final   Report Status 11/25/2016 FINAL  Final  Aerobic/Anaerobic Culture (surgical/deep wound)     Status: None (Preliminary result)   Collection Time: 11/24/16  4:11 PM  Result Value Ref Range Status   Specimen Description ABSCESS  Final   Special Requests ASPIRATED FROM DIVERTICULAR ABSCESS  Final   Gram Stain   Final    ABUNDANT WBC PRESENT, PREDOMINANTLY PMN MODERATE GRAM POSITIVE COCCI IN CHAINS FEW GRAM NEGATIVE RODS    Culture   Final    ABUNDANT GRAM NEGATIVE RODS NO ANAEROBES ISOLATED; CULTURE IN PROGRESS FOR 5 DAYS    Report Status PENDING  Incomplete    Imaging: No results  found.   Mattie Marlin, [facility] Surgery Pager [phone]

## 2016-11-27 NOTE — Discharge Summary (Signed)
Physician Discharge Summary  Matthew Reid ZOX:[id number] DOB: May 07, 1960  PCP: Farris Has, MD  Admit date: 11/24/2016 Discharge date: 11/27/2016  Recommendations for Outpatient Follow-up:  1. Dr. Farris Has, PCP in one week with repeat labs (CBC & BMP). Please follow final abscess culture results that were sent from the hospital. 2. Dr. Irish Lack, IR in the drain clinic in 1-2 weeks. 3. Dr. Marin Olp, Gen. Surgery in 3 weeks.   Home Health: None Equipment/Devices: None   Discharge Condition: Improved and stable  CODE STATUS: Full  Diet recommendation: Heart healthy and diabetic diet. One week of soft diet (in discharge instructions) then regular diet   Discharge Diagnoses:  Active Problems:   Intestinal diverticular abscess   Diverticulitis   Essential hypertension   Leukocytosis   HLD (hyperlipidemia)   Type 2 diabetes mellitus with complication, without long-term current use of insulin (HCC)   Brief Summary: 56 year old male with PMH of DM, recurrent diverticulitis, HTN, presented with approximately 2 weeks history of abdominal pain and subjective fevers, no significant improvement after 10 days of ciprofloxacin and Flagyl, outpatient CT abdomen confirmed large diverticular abscess. IR placed a percutaneous drain. Broad-spectrum IV antibiotics. CCS consulted.   Assessment & Plan:   Sigmoid diverticulitis with abscess - Status post PC drain placed by IR on 10/16.  - He was treated empirically with IV Zosyn pending culture results and completed 3 days of IV antibiotics. - Gen. surgery and IR have seen him and cleared him for discharge home with recommendations for outpatient follow-up in the drain clinic. Elective surgery will be discussed during outpatient follow-up. - Abscess culture result shows abundant Escherichia coli, resistant to ampicillin, Cipro and abundant viridans streptococci-sensitivity pending. I discussed with infectious disease M.D. on  call who recommended PO Vantin and Flagyl to complete 2-3 weeks treatment. - Patient was educated regarding drain management.  Essential hypertension Controlled on metoprolol.  Type II DM Continue metformin at discharge.  Hyperlipidemia Continue atorvastatin    Consultants:  Interventional radiology General surgery   Procedures:  Percutaneous drain     Discharge Instructions  Discharge Instructions    Call MD for:  difficulty breathing, headache or visual disturbances    Complete by:  As directed    Call MD for:  extreme fatigue    Complete by:  As directed    Call MD for:  persistant dizziness or light-headedness    Complete by:  As directed    Call MD for:  persistant nausea and vomiting    Complete by:  As directed    Call MD for:  redness, tenderness, or signs of infection (pain, swelling, redness, odor or green/yellow discharge around incision site)    Complete by:  As directed    Call MD for:  severe uncontrolled pain    Complete by:  As directed    Call MD for:  temperature >100.4    Complete by:  As directed    Diet - low sodium heart healthy    Complete by:  As directed    Diet Carb Modified    Complete by:  As directed    Discharge instructions    Complete by:  As directed    Do not submerge drain.  Continue flushes 1-2 times daily at home with sterile saline.  Continue to keep site clean and dry.   Discharge instructions    Complete by:  As directed    one week of soft diet (in discharge instructions) then regular diet  Increase activity slowly    Complete by:  As directed        Medication List    TAKE these medications   acetaminophen 500 MG tablet Commonly known as:  TYLENOL Take 500 mg by mouth every 4 (four) hours as needed (for fever or pain).   aspirin EC 81 MG tablet Take 81 mg by mouth daily.   atorvastatin 20 MG tablet Commonly known as:  LIPITOR Take 20 mg by mouth daily.   cefpodoxime 200 MG tablet Commonly known  as:  VANTIN Take 2 tablets (400 mg total) by mouth 2 (two) times daily.   metFORMIN 1000 MG tablet Commonly known as:  GLUCOPHAGE Take 1,000 mg by mouth 2 (two) times daily.   metoprolol succinate 50 MG 24 hr tablet Commonly known as:  TOPROL-XL Take 50 mg by mouth daily.   metroNIDAZOLE 500 MG tablet Commonly known as:  FLAGYL Take 1 tablet (500 mg total) by mouth 3 (three) times daily.   sodium chloride flush 0.9 % Soln Commonly known as:  NS Flush the abdominal drain with 10 ml every 8 hours.      Follow-up Information    Irish Lack, MD Follow up in 1 week(s).   Specialty:  Interventional Radiology Why:  Follow-up in the Interventional Radiology drain clinic in 1-2 weeks Contact information: [address] [place] [address] [phone]        Andria Meuse, MD. Schedule an appointment as soon as possible for a visit in 3 week(s).   Specialty:  General Surgery Why:  for follow up to discuss surgical options Contact information: [address] [place] [address] [phone]        Farris Has, MD. Schedule an appointment as soon as possible for a visit in 1 week(s).   Specialty:  Family Medicine Why:  To be seen with repeat labs (CBC & BMP). Contact information: [address] [place] [address] [phone]          Allergies  Allergen Reactions  . Glipizide Other (See Comments)    Made the patient feel "lousy"      Procedures/Studies: Ct Abdomen Pelvis W Contrast  Addendum Date: 11/24/2016   ADDENDUM REPORT: 11/24/2016 12:33 ADDENDUM: Salient findings discussed by telephone with Dr. Mila Palmer on 11/24/2016 at 1219 hours. Electronically Signed   By: Odessa Fleming M.D.   On: 11/24/2016 12:33   Result Date: 11/24/2016 CLINICAL DATA:  56 year old male with persistent left lower quadrant abdominal pain, low-grade fever, nausea vomiting. Diagnosed with diverticulitis and started on antibiotics  11/13/2016, but symptoms persist. EXAM: CT ABDOMEN AND PELVIS WITH CONTRAST TECHNIQUE: Multidetector CT imaging of the abdomen and pelvis was performed using the standard protocol following bolus administration of intravenous contrast. CONTRAST:  ISOVUE-300 IOPAMIDOL (ISOVUE-300) INJECTION 61% COMPARISON:  None. FINDINGS: Lower chest: Negative lung bases. No pericardial or pleural effusion. Small calcified right hilar lymph nodes compatible with granulomatous disease. Hepatobiliary: Decreased density in keeping with hepatic steatosis. Negative gallbladder. No biliary ductal enlargement. Pancreas: Negative. Spleen: Negative; multiple punctate calcified granulomas. Adrenals/Urinary Tract: Possible small right adrenal gland myelolipoma (series 2, image 26, benign), with otherwise normal adrenal glands. Bilateral renal enhancement and contrast excretion is normal. 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 The left ureters are nondilated despite extension through the left retroperitoneal inflammation. Unremarkable urinary bladder. Stomach/Bowel: Negative rectum. Moderate to severe inflammation surrounding the sigmoid colon over a segment of about  12 cm with numerous diverticula in the region. Sigmoid wall thickening up to 2 cm. Along the superior and posterior aspect of the sigmoid segment there is a large oval gas and fluid containing collection encompassing 64 x 86 x 63 mm (AP by transverse by CC) with an estimated volume of 173 mL. See series 2, image 65, sagittal image 90. Inflammation surrounding this structure in tracking along the left pelvic side wall. No other extraluminal gas or fluid identified. Diverticulosis continues throughout the more proximal large bowel but with no other active inflammation identified. Normal appendix. Negative terminal ileum. No dilated small bowel. Negative stomach and duodenum. Vascular/Lymphatic: Major arterial structures in the abdomen and pelvis are  patent. Portal venous system is patent. New line reactive appearing left retroperitoneal and mesenteric lymph nodes about the abnormal sigmoid colon. Reproductive: Evidence of prior vasectomy. Other: Evidence of prior right inguinal hernia repair. No pelvic free fluid. Musculoskeletal: Negative. IMPRESSION: 1. Complicated Sigmoid Diverticulitis with an 8.6 cm Pericolic Abscess in the left lower abdomen. Recommend hospital admission and consultation of both Surgery and Interventional Radiology for treatment planning and drainage. 2. Reactive appearing abdominal lymph nodes. No other complicating features identified. 3. Hepatic steatosis. 4. 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 5. Incidental old granulomatous disease. Electronically Signed: By: Odessa Fleming M.D. On: 11/24/2016 12:18   Ct Image Guided Drainage By Percutaneous Catheter  Result Date: 11/24/2016 CLINICAL DATA:  Large left lower quadrant sigmoid diverticular peritoneal abscess requiring drainage. EXAM: CT GUIDED CATHETER DRAINAGE OF PERITONEAL ABSCESS COMPARISON:  CT of the abdomen and pelvis performed earlier today. ANESTHESIA/SEDATION: 2.0 mg IV Versed 100 mcg IV Fentanyl Total Moderate Sedation Time:  18 minutes The patient's level of consciousness and physiologic status were continuously monitored during the procedure by Radiology nursing. PROCEDURE: The procedure, risks, benefits, and alternatives were explained to the patient. Questions regarding the procedure were encouraged and answered. The patient understands and consents to the procedure. A time out was performed prior to initiating the procedure. The left lower abdominal wall was prepped with chlorhexidine in a sterile fashion, and a sterile drape was applied covering the operative field. A sterile gown and sterile gloves were used for the procedure. Local anesthesia was provided with 1% Lidocaine. CT was performed in a supine position with the left side rolled up  slightly. Under CT guidance, an 18 gauge trocar needle was advanced into a left lower quadrant diverticular abscess. A fluid sample was aspirated and sent for culture analysis. A guidewire was advanced into the collection. The percutaneous tract was dilated and a 12 French percutaneous drainage catheter advanced over the wire. Catheter positioning was confirmed by CT. The catheter was flushed and connected to a suction bulb. It was secured at the skin with a Prolene retention suture and StatLock device. COMPLICATIONS: None FINDINGS: Large left lower quadrant diverticular abscess is identified superior and posterior to the sigmoid colon. Aspiration yielded grossly purulent greenish white liquid. A 12 French drain was placed and is draining well after placement. IMPRESSION: CT-guided percutaneous catheter drainage of sigmoid diverticular abscess. A 12 French drainage catheter was placed and attached to suction bulb drainage. Drain output will be followed. A fluid sample was sent for culture analysis. Electronically Signed   By: Irish Lack M.D.   On: 11/24/2016 16:39      Subjective: Seen this morning. Feeling much better than yesterday. Minimal intermittent pain at drain site but no deep sited pain. Tolerating diet. No nausea, vomiting or diarrhea  reported.  Discharge Exam:  Vitals:   11/26/16 0938 11/26/16 1300 11/26/16 2039 11/27/16 0630  BP: 114/60 116/67 126/70 121/69  Pulse: 77 75 78 63  Resp: 18 16 18 16   Temp: 98 F (36.7 C) 98.6 F (37 C) 98.6 F (37 C) 98.6 F (37 C)  TempSrc: Oral Oral Oral Oral  SpO2: 95% 96% 98% 96%  Weight:      Height:        General exam: Young male, moderately built and obese lying comfortably supine in bed. Respiratory system: Clear to auscultation. Respiratory effort normal.  Cardiovascular system: S1 & S2 heard, RRR. No JVD, murmurs, rubs, gallops or clicks. No pedal edema.  Gastrointestinal system: Abdomen is minimally tender in left lower quadrant  without peritoneal signs. LLQ PC drain with minimal blood stained fluid. No organomegaly or masses felt. Normal bowel sounds heard. Central nervous system: Alert and oriented. No focal neurological deficits.  Extremities: Symmetric 5 x 5 power. Skin: No rashes, lesions or ulcers Psychiatry: Judgement and insight appear normal. Mood & affect appropriate.     The results of significant diagnostics from this hospitalization (including imaging, microbiology, ancillary and laboratory) are listed below for reference.     Microbiology: Recent Results (from the past 240 hour(s))  Urine Culture     Status: None   Collection Time: 11/24/16  1:41 PM  Result Value Ref Range Status   Specimen Description URINE, CLEAN CATCH  Final   Special Requests NONE  Final   Culture NO GROWTH  Final   Report Status 11/25/2016 FINAL  Final  Aerobic/Anaerobic Culture (surgical/deep wound)     Status: None (Preliminary result)   Collection Time: 11/24/16  4:11 PM  Result Value Ref Range Status   Specimen Description ABSCESS  Final   Special Requests ASPIRATED FROM DIVERTICULAR ABSCESS  Final   Gram Stain   Final    ABUNDANT WBC PRESENT, PREDOMINANTLY PMN MODERATE GRAM POSITIVE COCCI IN CHAINS FEW GRAM NEGATIVE RODS    Culture   Final    ABUNDANT ESCHERICHIA COLI ABUNDANT VIRIDANS STREPTOCOCCUS NO ANAEROBES ISOLATED; CULTURE IN PROGRESS FOR 5 DAYS    Report Status PENDING  Incomplete   Organism ID, Bacteria ESCHERICHIA COLI  Final      Susceptibility   Escherichia coli - MIC*    AMPICILLIN >=32 RESISTANT Resistant     CEFAZOLIN <=4 SENSITIVE Sensitive     CEFEPIME <=1 SENSITIVE Sensitive     CEFTAZIDIME <=1 SENSITIVE Sensitive     CEFTRIAXONE <=1 SENSITIVE Sensitive     CIPROFLOXACIN >=4 RESISTANT Resistant     GENTAMICIN <=1 SENSITIVE Sensitive     IMIPENEM <=0.25 SENSITIVE Sensitive     TRIMETH/SULFA <=20 SENSITIVE Sensitive     AMPICILLIN/SULBACTAM >=32 RESISTANT Resistant     PIP/TAZO <=4  SENSITIVE Sensitive     Extended ESBL NEGATIVE Sensitive     * ABUNDANT ESCHERICHIA COLI     Labs: CBC:  Recent Labs Lab 11/24/16 1330 11/25/16 0412 11/26/16 0522  WBC 17.1* 11.1* 9.7  HGB 15.4 13.3 12.5*  HCT 47.2 41.1 39.5  MCV 90.1 90.9 91.6  PLT 391 368 385   Basic Metabolic Panel:  Recent Labs Lab 11/24/16 1256 11/25/16 0412 11/26/16 0522  NA 131* 133* 136  K 4.4 4.1 4.0  CL 97* 97* 100*  CO2 20* 23 25  GLUCOSE 134* 113* 108*  BUN 8 9 5*  CREATININE 0.90 1.05 0.89  CALCIUM 8.9 8.7* 8.6*   Liver Function Tests:  Recent Labs Lab 11/24/16 1256 11/25/16 0412  AST 26 20  ALT 32 26  ALKPHOS 95 76  BILITOT 0.5 0.6  PROT 7.8 6.8  ALBUMIN 3.5 3.0*   CBG:  Recent Labs Lab 11/26/16 0746 11/26/16 1129 11/26/16 1725 11/26/16 2141 11/27/16 0822  GLUCAP 104* 112* 138* 138* 124*   Urinalysis    Component Value Date/Time   COLORURINE STRAW (A) 11/24/2016 1341   APPEARANCEUR CLEAR 11/24/2016 1341   LABSPEC 1.044 (H) 11/24/2016 1341   PHURINE 6.0 11/24/2016 1341   GLUCOSEU NEGATIVE 11/24/2016 1341   HGBUR SMALL (A) 11/24/2016 1341   BILIRUBINUR NEGATIVE 11/24/2016 1341   KETONESUR 20 (A) 11/24/2016 1341   PROTEINUR NEGATIVE 11/24/2016 1341   NITRITE NEGATIVE 11/24/2016 1341   LEUKOCYTESUR NEGATIVE 11/24/2016 1341    Discussed with patient's spouse at bedside.  Time coordinating discharge: Less than 30 minutes  SIGNED:  Marcellus Scott, MD, FACP, FHM. [facility] Pager [phone]  If 7PM-7AM, please contact night-coverage [email or web address] Password [email or web address] 11/27/2016, 12:58 PM

## 2016-11-27 NOTE — Discharge Planning (Signed)
Patient discharged home in stable condition. Verbalizes understanding of all discharge instructions, including home medications, drain care and follow up appointments. 

## 2016-11-27 NOTE — Discharge Instructions (Signed)
Diverticulitis Diverticulitis is inflammation or infection of small pouches in your colon that form when you have a condition called diverticulosis. The pouches in your colon are called diverticula. Your colon, or large intestine, is where water is absorbed and stool is formed. Complications of diverticulitis can include:  Bleeding.  Severe infection.  Severe pain.  Perforation of your colon.  Obstruction of your colon.  What are the causes? Diverticulitis is caused by bacteria. Diverticulitis happens when stool becomes trapped in diverticula. This allows bacteria to grow in the diverticula, which can lead to inflammation and infection. What increases the risk? People with diverticulosis are at risk for diverticulitis. Eating a diet that does not include enough fiber from fruits and vegetables may make diverticulitis more likely to develop. What are the signs or symptoms? Symptoms of diverticulitis may include:  Abdominal pain and tenderness. The pain is normally located on the left side of the abdomen, but may occur in other areas.  Fever and chills.  Bloating.  Cramping.  Nausea.  Vomiting.  Constipation.  Diarrhea.  Blood in your stool.  How is this diagnosed? Your health care provider will ask you about your medical history and do a physical exam. You may need to have tests done because many medical conditions can cause the same symptoms as diverticulitis. Tests may include:  Blood tests.  Urine tests.  Imaging tests of the abdomen, including X-rays and CT scans.  When your condition is under control, your health care provider may recommend that you have a colonoscopy. A colonoscopy can show how severe your diverticula are and whether something else is causing your symptoms. How is this treated? Most cases of diverticulitis are mild and can be treated at home. Treatment may include:  Taking over-the-counter pain medicines.  Following a clear liquid  diet.  Taking antibiotic medicines by mouth for 7-10 days.  More severe cases may be treated at a hospital. Treatment may include:  Not eating or drinking.  Taking prescription pain medicine.  Receiving antibiotic medicines through an IV tube.  Receiving fluids and nutrition through an IV tube.  Surgery.  Follow these instructions at home:  Follow your health care providers instructions carefully.  Follow a full liquid diet or other diet as directed by your health care provider. After your symptoms improve, your health care provider may tell you to change your diet. He or she may recommend you eat a high-fiber diet. Fruits and vegetables are good sources of fiber. Fiber makes it easier to pass stool.  Take fiber supplements or probiotics as directed by your health care provider.  Only take medicines as directed by your health care provider.  Keep all your follow-up appointments. Contact a health care provider if:  Your pain does not improve.  You have a hard time eating food.  Your bowel movements do not return to normal. Get help right away if:  Your pain becomes worse.  Your symptoms do not get better.  Your symptoms suddenly get worse.  You have a fever.  You have repeated vomiting.  You have bloody or black, tarry stools. This information is not intended to replace advice given to you by your health care provider. Make sure you discuss any questions you have with your health care provider. Document Released: 11/05/2004 Document Revised: 07/04/2015 Document Reviewed: 12/21/2012 Elsevier Interactive Patient Education  2017 [employer]  Soft-Food Meal Plan Follow for one week after discharge A soft-food meal plan includes foods that are safe and easy to  swallow. This meal plan typically is used:  If you are having trouble chewing or swallowing foods.  As a transition meal plan after only having had liquid meals for a long period.  What do I need to know  about the soft-food meal plan? A soft-food meal plan includes tender foods that are soft and easy to chew and swallow. In most cases, bite-sized pieces of food are easier to swallow. A bite-sized piece is about  inch or smaller. Foods in this plan do not need to be ground or pureed. Foods that are very hard, crunchy, or sticky should be avoided. Also, breads, cereals, yogurts, and desserts with nuts, seeds, or fruits should be avoided. What foods can I eat? Grains Rice and wild rice. Moist bread, dressing, pasta, and noodles. Well-moistened dry or cooked cereals, such as farina (cooked wheat cereal), oatmeal, or grits. Biscuits, breads, muffins, pancakes, and waffles that have been well moistened. Vegetables Shredded lettuce. Cooked, tender vegetables, including potatoes without skins. Vegetable juices. Broths or creamed soups made with vegetables that are not stringy or chewy. Strained tomatoes (without seeds). Fruits Canned or well-cooked fruits. Soft (ripe), peeled fresh fruits, such as peaches, nectarines, kiwi, cantaloupe, honeydew melon, and watermelon (without seeds). Soft berries with small seeds, such as strawberries. Fruit juices (without pulp). Meats and Other Protein Sources Moist, tender, lean beef. Mutton. Lamb. Veal. Chicken. [place]. Liver. Ham. Fish without bones. Eggs. Dairy Milk, milk drinks, and cream. Plain cream cheese and cottage cheese. Plain yogurt. Sweets/Desserts Flavored gelatin desserts. Custard. Plain ice cream, frozen yogurt, sherbet, milk shakes, and malts. Plain cakes and cookies. Plain hard candy. Other Butter, margarine (without trans fat), and cooking oils. Mayonnaise. Cream sauces. Mild spices, salt, and sugar. Syrup, molasses, honey, and jelly. The items listed above may not be a complete list of recommended foods or beverages. Contact your dietitian for more options. What foods are not recommended? Grains Dry bread, toast, crackers that have not been  moistened. Coarse or dry cereals, such as bran, granola, and shredded wheat. Tough or chewy crusty breads, such as [place] bread or baguettes. Vegetables Corn. Raw vegetables except shredded lettuce. Cooked vegetables that are tough or stringy. Tough, crisp, fried potatoes and potato skins. Fruits Fresh fruits with skins or seeds or both, such as apples, pears, or grapes. Stringy, high-pulp fruits, such as papaya, pineapple, coconut, or mango. Fruit leather, fruit roll-ups, and all dried fruits. Meats and Other Protein Sources Sausages and hot dogs. Meats with gristle. Fish with bones. Nuts, seeds, and chunky peanut or other nut butters. Sweets/Desserts Cakes or cookies that are very dry or chewy. The items listed above may not be a complete list of foods and beverages to avoid. Contact your dietitian for more information. This information is not intended to replace advice given to you by your health care provider. Make sure you discuss any questions you have with your health care provider. Document Released: 05/05/2007 Document Revised: 07/04/2015 Document Reviewed: 12/23/2012 Elsevier Interactive Patient Education  2017 Elsevier Inc.   Additional discharge instructions  Please get your medications reviewed and adjusted by your Primary MD.  Please request your Primary MD to go over all Hospital Tests and Procedure/Radiological results at the follow up, please get all Hospital records sent to your Prim MD by signing hospital release before you go home.  If you had Pneumonia of Lung problems at the Hospital: Please get a 2 view Chest X ray done in 6-8 weeks after hospital discharge or sooner if instructed  by your Primary MD.  If you have Congestive Heart Failure: Please call your Cardiologist or Primary MD anytime you have any of the following symptoms:  1) 3 pound weight gain in 24 hours or 5 pounds in 1 week  2) shortness of breath, with or without a dry hacking cough  3) swelling in the  hands, feet or stomach  4) if you have to sleep on extra pillows at night in order to breathe  Follow cardiac low salt diet and 1.5 lit/day fluid restriction.  If you have diabetes Accuchecks 4 times/day, Once in AM empty stomach and then before each meal. Log in all results and show them to your primary doctor at your next visit. If any glucose reading is under 80 or above 300 call your primary MD immediately.  If you have Seizure/Convulsions/Epilepsy: Please do not drive, operate heavy machinery, participate in activities at heights or participate in high speed sports until you have seen by Primary MD or a Neurologist and advised to do so again.  If you had Gastrointestinal Bleeding: Please ask your Primary MD to check a complete blood count within one week of discharge or at your next visit. Your endoscopic/colonoscopic biopsies that are pending at the time of discharge, will also need to followed by your Primary MD.  Get Medicines reviewed and adjusted. Please take all your medications with you for your next visit with your Primary MD  Please request your Primary MD to go over all hospital tests and procedure/radiological results at the follow up, please ask your Primary MD to get all Hospital records sent to his/her office.  If you experience worsening of your admission symptoms, develop shortness of breath, life threatening emergency, suicidal or homicidal thoughts you must seek medical attention immediately by calling 911 or calling your MD immediately  if symptoms less severe.  You must read complete instructions/literature along with all the possible adverse reactions/side effects for all the Medicines you take and that have been prescribed to you. Take any new Medicines after you have completely understood and accpet all the possible adverse reactions/side effects.   Do not drive or operate heavy machinery when taking Pain medications.   Do not take more than prescribed Pain,  Sleep and Anxiety Medications  Special Instructions: If you have smoked or chewed Tobacco  in the last 2 yrs please stop smoking, stop any regular Alcohol  and or any Recreational drug use.  Wear Seat belts while driving.  Please note You were cared for by a hospitalist during your hospital stay. If you have any questions about your discharge medications or the care you received while you were in the hospital after you are discharged, you can call the unit and asked to speak with the hospitalist on call if the hospitalist that took care of you is not available. Once you are discharged, your primary care physician will handle any further medical issues. Please note that NO REFILLS for any discharge medications will be authorized once you are discharged, as it is imperative that you return to your primary care physician (or establish a relationship with a primary care physician if you do not have one) for your aftercare needs so that they can reassess your need for medications and monitor your lab values.  You can reach the hospitalist office at phone [phone] or fax [phone]   If you do not have a primary care physician, you can call [phone] for a physician referral.

## 2016-11-27 NOTE — Progress Notes (Signed)
Pt and wife observed flushing of JP and stated that they will be able to do this at home. Supplies and flushes sent home with pt..Marland Kitchen

## 2016-11-29 LAB — AEROBIC/ANAEROBIC CULTURE (SURGICAL/DEEP WOUND)

## 2016-11-29 LAB — AEROBIC/ANAEROBIC CULTURE W GRAM STAIN (SURGICAL/DEEP WOUND)

## 2016-11-30 ENCOUNTER — Other Ambulatory Visit: Payer: Self-pay | Admitting: Surgery

## 2016-11-30 DIAGNOSIS — R1032 Left lower quadrant pain: Secondary | ICD-10-CM | POA: Diagnosis not present

## 2016-11-30 DIAGNOSIS — K5720 Diverticulitis of large intestine with perforation and abscess without bleeding: Secondary | ICD-10-CM | POA: Diagnosis not present

## 2016-11-30 DIAGNOSIS — K630 Abscess of intestine: Secondary | ICD-10-CM

## 2016-12-08 ENCOUNTER — Ambulatory Visit
Admission: RE | Admit: 2016-12-08 | Discharge: 2016-12-08 | Disposition: A | Discharge status: Home / Self Care | Payer: BLUE CROSS/BLUE SHIELD | Source: Ambulatory Visit | Attending: Surgery | Admitting: Surgery

## 2016-12-08 ENCOUNTER — Ambulatory Visit
Admission: RE | Admit: 2016-12-08 | Discharge: 2016-12-08 | Disposition: A | Discharge status: Home / Self Care | Payer: BLUE CROSS/BLUE SHIELD | Source: Ambulatory Visit | Attending: Student | Admitting: Student

## 2016-12-08 DIAGNOSIS — K630 Abscess of intestine: Secondary | ICD-10-CM

## 2016-12-08 DIAGNOSIS — Z4682 Encounter for fitting and adjustment of non-vascular catheter: Secondary | ICD-10-CM | POA: Diagnosis not present

## 2016-12-08 DIAGNOSIS — K5720 Diverticulitis of large intestine with perforation and abscess without bleeding: Secondary | ICD-10-CM | POA: Diagnosis not present

## 2016-12-08 DIAGNOSIS — K4090 Unilateral inguinal hernia, without obstruction or gangrene, not specified as recurrent: Secondary | ICD-10-CM | POA: Diagnosis not present

## 2016-12-08 HISTORY — PX: IR RADIOLOGIST EVAL & MGMT: IMG5224

## 2016-12-08 IMAGING — RF DG SINUS / FISTULA TRACT / ABSCESSOGRAM
7 series · 14 of 20 positions shown · non-contrast
Comparison: none

INDICATION: Status post percutaneous catheter drainage of sigmoid diverticular
abscess on 11/24/2016. CT today demonstrates resolution of the
abscess. There is no significant current fluid return from the
drainage catheter.

[Series 1: one shot · 1 of 1 slices shown (1 of 3)]
[im 1/1]
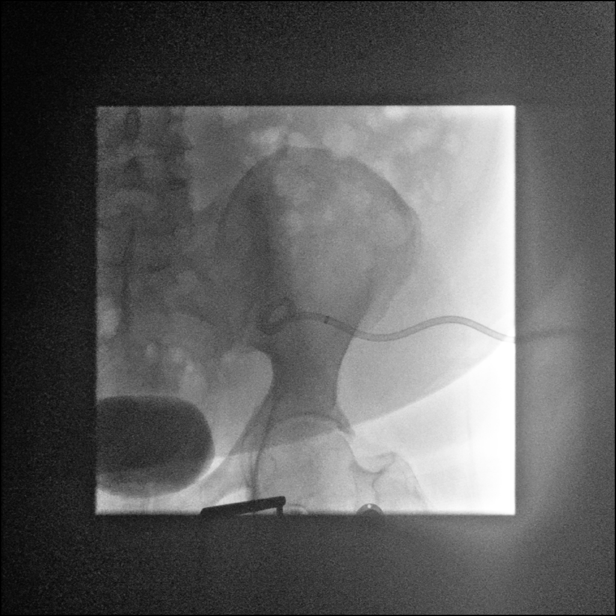

[Series 2: sequence · 2 of 56 frames shown (1 of 4)]
[frame 29/56]
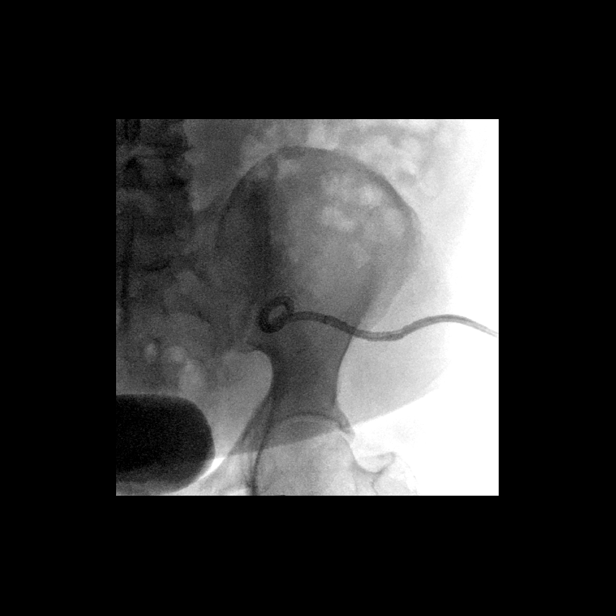
[frame 39/56]
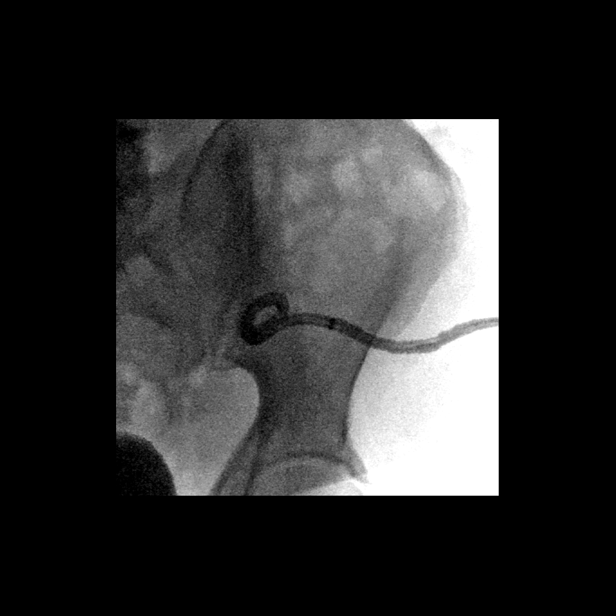

[Series 3: sequence · 3 of 61 frames shown (2 of 4)]
[frame 10/61]
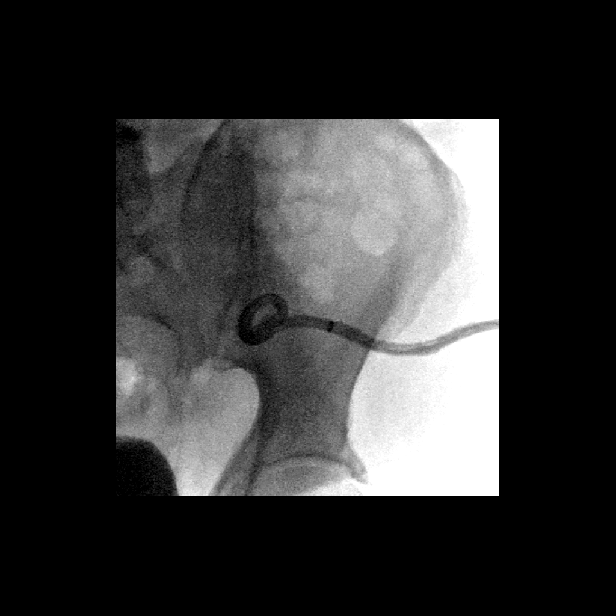
[frame 14/61]
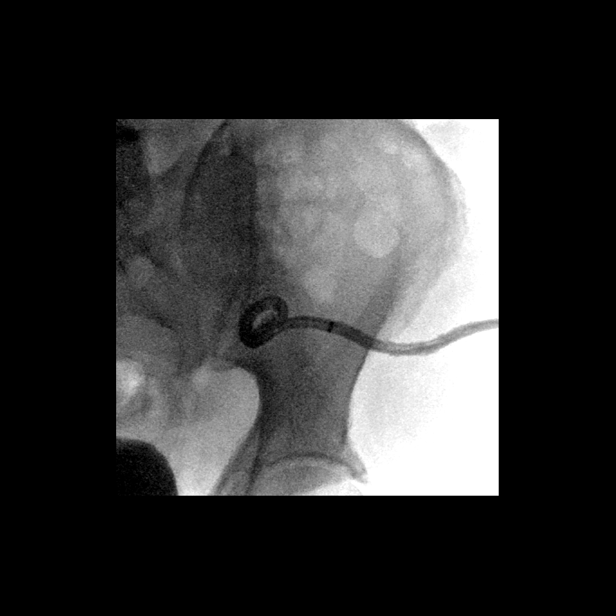
[frame 31/61]
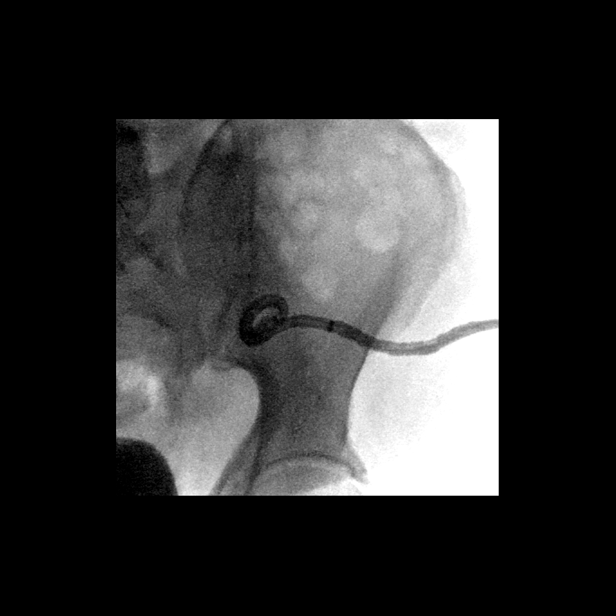

[Series 4: one shot · 1 of 1 slices shown (2 of 3)]
[im 1/1]
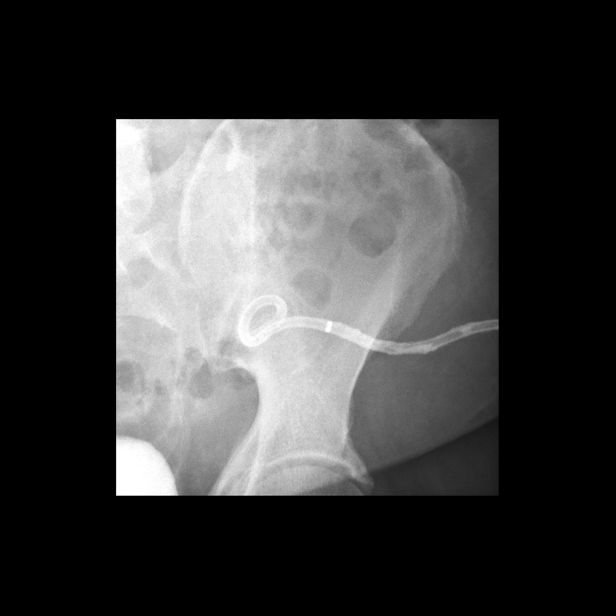

[Series 5: sequence · 3 of 65 frames shown (3 of 4)]
[frame 10/65]
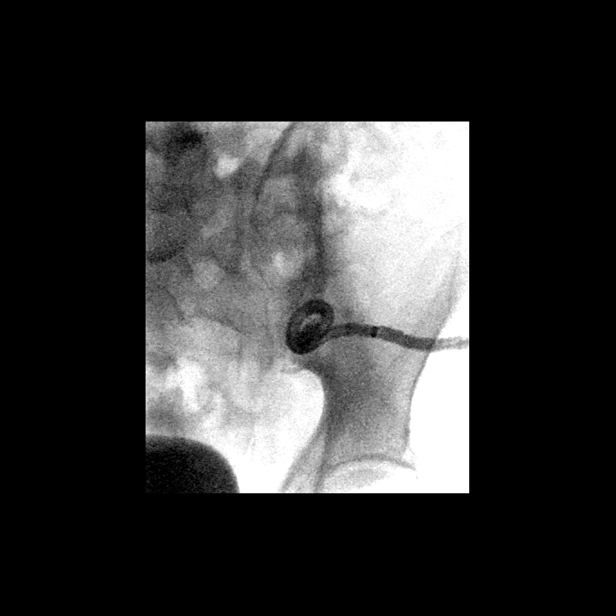
[frame 37/65]
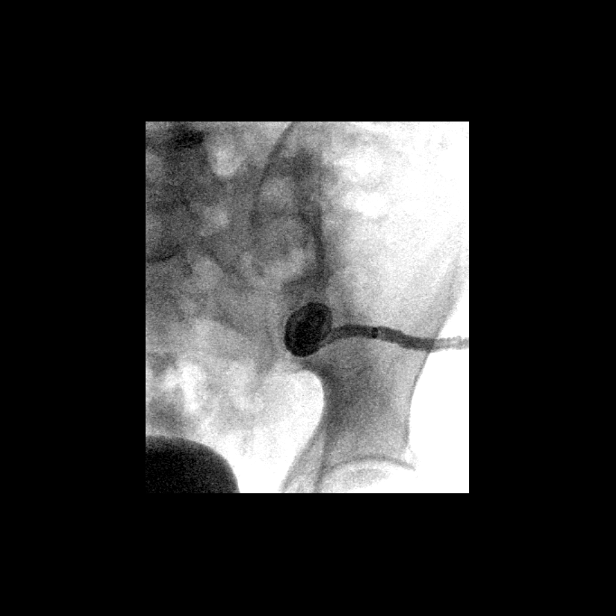
[frame 56/65]
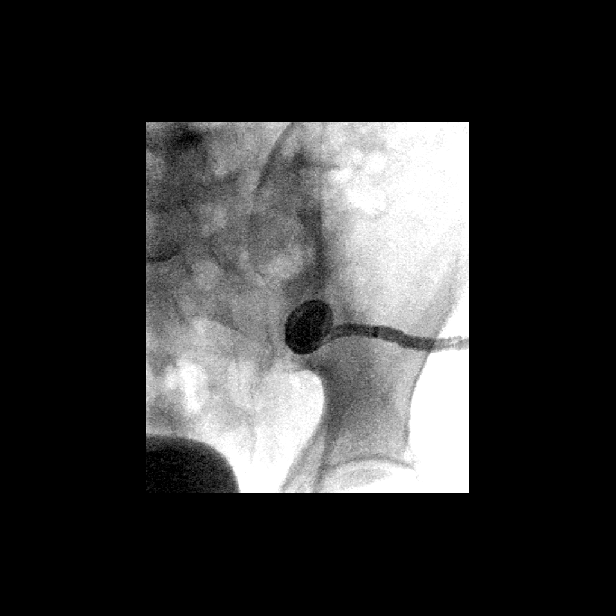

[Series 6: sequence · 3 of 36 frames shown (4 of 4)]
[frame 10/36]
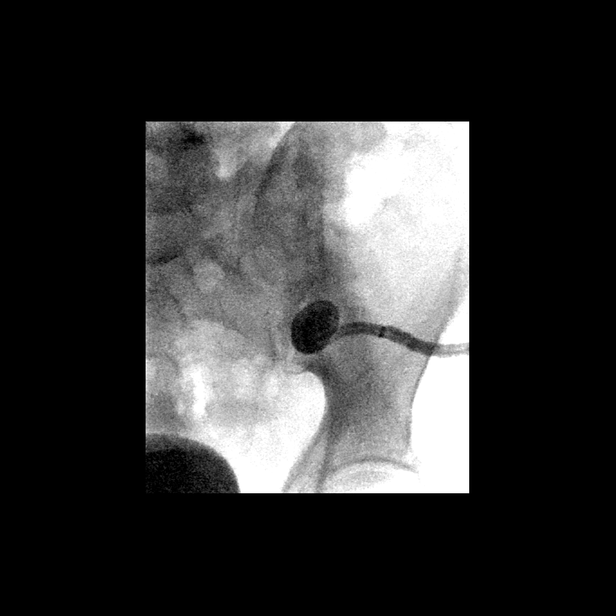
[frame 19/36]
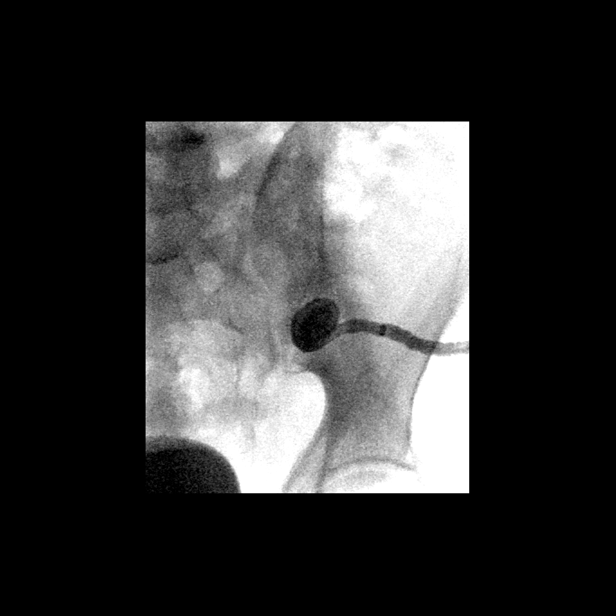
[frame 31/36]
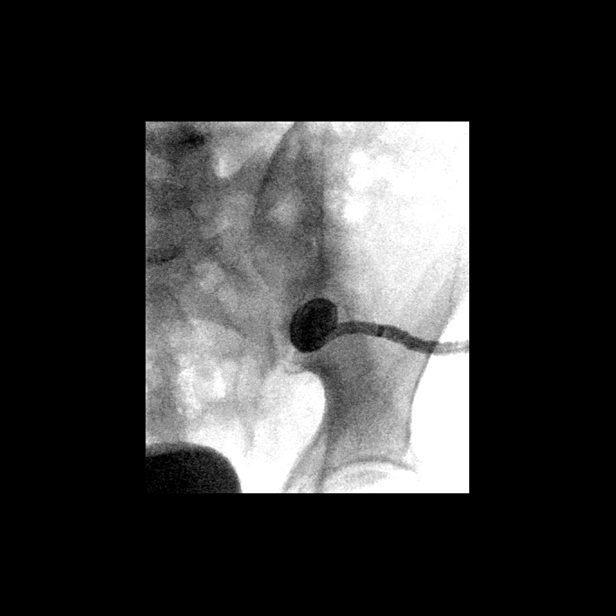

[Series 7: one shot · 1 of 2 slices shown (3 of 3)]
[im 2/2]
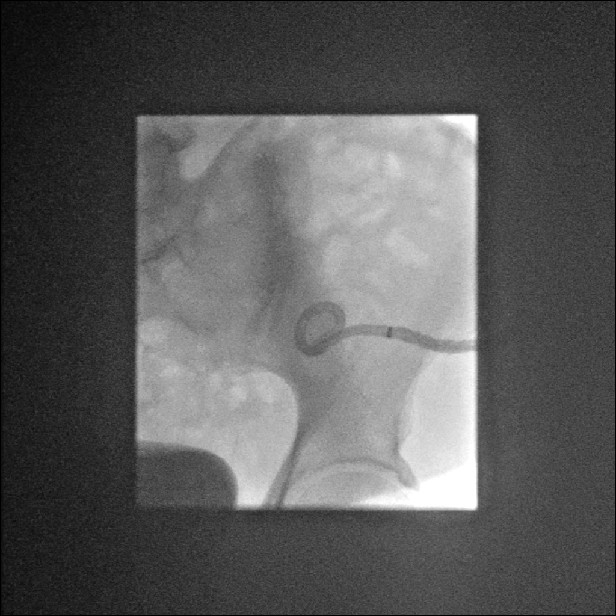

[14 of 20 positions shown; findings below may reference images not displayed]

EXAM:
INJECTION OF PERCUTANEOUS ABSCESS DRAIN UNDER FLUOROSCOPY

MEDICATIONS:
None

ANESTHESIA/SEDATION:
None

CONTRAST:  25 mL Omnipaque 300

FLUOROSCOPY TIME:  1 minutes and 18 seconds.  58 mGy.

COMPLICATIONS:
None immediate.

PROCEDURE:
The percutaneous drain was injected with contrast material under
fluoroscopy. Several fluoroscopic spot images were obtained.

The drain was then cut and removed. A dressing was applied over the
catheter exit site.
FINDINGS: With injection of the drainage catheter, only a small cavity fills
with contrast corresponding to the loop of the drain and contrast
immediately flows around the drainage catheter and out the catheter
exit site. No fistula is identified to the adjacent sigmoid colon.
Given resolution of the abscess by CT and no current fluid return
from the drain, the drain was removed.
IMPRESSION: No fistula demonstrated to the sigmoid colon with injection of the
abscess drain. Given resolution of the abscess by CT and no current
fluid return from the drain, the drain was removed today.

## 2016-12-08 MED ORDER — IOPAMIDOL (ISOVUE-300) INJECTION 61%
125.0000 mL | Freq: Once | INTRAVENOUS | Status: AC | PRN
  Administered 2016-12-08: 125 mL via INTRAVENOUS

## 2016-12-08 NOTE — Progress Notes (Signed)
Referring Physician(s): Dr. Marin Olphristopher White  Chief Complaint: The patient is seen in follow up today s/p intra-abdominal abscess s/p drain placement 11/24/16 by Dr. Fredia SorrowYamagata  History of present illness: Matthew Reid is a 56 y.o. male who [facility] with divert[facility] abscess. He underwent successful abdominal abscess drain placement with Dr. Fredia SorrowYamagata 11/24/16 and was discharged home in improved condition.  He returns to IR drain clinic today for follow-up.  He has been doing well at home.  He was flushing his drain daily until he ran out of flushes this past weekend.  Output has been decreased, ranging from 2-8 mL daily. He denies fever, chills, abdominal pain and has been tolerating his diet at home. He denies concerns related to his drain.    Past Medical History:  Diagnosis Date  . Diverticulitis   . Dysrhythmia    "1990s; palpations; tested; turned out to be stress" (11/26/2016)  . High cholesterol   . Hypertension   . Type II diabetes mellitus (HCC)     Past Surgical History:  Procedure Laterality Date  . ABDOMINAL HERNIA REPAIR  ~ 1998  . CT GUIDE ABSCESS DRAINAGE  ([facility] HX)  11/24/2016   CT guided percutaneous catheter drainage of diverticular abscess/notes 11/24/2016  . HERNIA REPAIR    . NASAL SEPTUM SURGERY  1980s  . TONSILLECTOMY      Allergies: Glipizide  Medications: Prior to Admission medications   Medication Sig Start Date End Date Taking? Authorizing Provider  acetaminophen (TYLENOL) 500 MG tablet Take 500 mg by mouth every 4 (four) hours as needed (for fever or pain).    [provider]  aspirin EC 81 MG tablet Take 81 mg by mouth daily.    [provider]  atorvastatin (LIPITOR) 20 MG tablet Take 20 mg by mouth daily. 11/23/16   [provider]  cefpodoxime (VANTIN) 200 MG tablet Take 2 tablets (400 mg total) by mouth 2 (two) times daily. 11/27/16   Hongalgi, Maximino GreenlandAnand D, MD  metFORMIN (GLUCOPHAGE) 1000 MG tablet Take  1,000 mg by mouth 2 (two) times daily. 10/19/16   [provider]  metoprolol succinate (TOPROL-XL) 50 MG 24 hr tablet Take 50 mg by mouth daily. 10/19/16   [provider]  metroNIDAZOLE (FLAGYL) 500 MG tablet Take 1 tablet (500 mg total) by mouth 3 (three) times daily. 11/27/16   Hongalgi, Maximino GreenlandAnand D, MD  sodium chloride flush (NS) 0.9 % SOLN Flush the abdominal drain with 10 ml every 8 hours. 11/27/16   Elease EtienneHongalgi, Anand D, MD     Family History  Problem Relation Age of Onset  . Hypertension Father     Social History   Social History  . Marital status: Married    Spouse name: N/A  . Number of children: N/A  . Years of education: N/A   Social History Main Topics  . Smoking status: Former Smoker    Types: Cigarettes  . Smokeless tobacco: Former NeurosurgeonUser     Comment: 11/26/2016 "stopped smoking in the mid 1980s; quit dipping in ~ 2012; can't get off the nicorette gum"  . Alcohol use Yes     Comment: 11/26/2016 "a few times/year"  . Drug use: Yes    Types: Marijuana     Comment: 11/26/2016 "a few times/year"  . Sexual activity: Yes   Other Topics Concern  . Not on file   Social History Narrative  . No narrative on file     Vital Signs: There were no vitals  taken for this visit.  Physical Exam  Constitutional: He is oriented to person, place, and time. He appears well-developed.  Cardiovascular: Normal rate and regular rhythm.   Pulmonary/Chest: Effort normal and breath sounds normal. No respiratory distress.  Abdominal: Soft.  Non-tender.  LLQ drain intact.  Insertion site clean and dry.  Trace amount of old liquid output in bulb.   Neurological: He is alert and oriented to person, place, and time.  Skin: Skin is warm and dry.     Imaging: No results found.  Labs:  CBC:  Recent Labs  11/24/16 1330 11/25/16 0412 11/26/16 0522  WBC 17.1* 11.1* 9.7  HGB 15.4 13.3 12.5*  HCT 47.2 41.1 39.5  PLT 391 368 385    COAGS:  Recent Labs   11/24/16 1510  INR 1.06    BMP:  Recent Labs  11/24/16 1256 11/25/16 0412 11/26/16 0522  NA 131* 133* 136  K 4.4 4.1 4.0  CL 97* 97* 100*  CO2 20* 23 25  GLUCOSE 134* 113* 108*  BUN 8 9 5*  CALCIUM 8.9 8.7* 8.6*  CREATININE 0.90 1.05 0.89  GFRNONAA >60 >60 >60  GFRAA >60 >60 >60    LIVER FUNCTION TESTS:  Recent Labs  11/24/16 1256 11/25/16 0412  BILITOT 0.5 0.6  AST 26 20  ALT 32 26  ALKPHOS 95 76  PROT 7.8 6.8  ALBUMIN 3.5 3.0*    Assessment: Patient with history of diverticular abscess s/p drain placement with Dr. Fredia Sorrow 11/24/16 presents to IR clinic for follow-up of his drain.  He was managing his drain at home and flushing daily until he ran out of sterile flushes a few days ago.  He brings an output log which shows very little output over the past week.  He denies fever, chills, abdominal pain, and has been eating well at home.  CT Abdomen and results of drain injection reviewed with Dr. Fredia Sorrow who agrees drain can be removed today.  PA removed drain with issue.  Patient tolerated well.  No current follow-up in IR clinic needed.  Patient instructed to keep his scheduled follow-up with surgical team.   Signed: Hoyt Koch, PA 12/08/2016, 2:56 PM   Please refer to Dr. Fredia Sorrow attestation of this note for management and plan.

## 2016-12-09 DIAGNOSIS — K5720 Diverticulitis of large intestine with perforation and abscess without bleeding: Secondary | ICD-10-CM | POA: Diagnosis not present

## 2016-12-09 DIAGNOSIS — E119 Type 2 diabetes mellitus without complications: Secondary | ICD-10-CM | POA: Diagnosis not present

## 2016-12-09 DIAGNOSIS — Z23 Encounter for immunization: Secondary | ICD-10-CM | POA: Diagnosis not present

## 2016-12-09 DIAGNOSIS — I10 Essential (primary) hypertension: Secondary | ICD-10-CM | POA: Diagnosis not present

## 2016-12-09 DIAGNOSIS — Z09 Encounter for follow-up examination after completed treatment for conditions other than malignant neoplasm: Secondary | ICD-10-CM | POA: Diagnosis not present

## 2016-12-09 IMAGING — CT CT ABD-PELV W/ CM
2 of 4 series · 9 of 36 positions shown, 15 images · IV contrast (iopamidol)
Comparison: 11/24/2016

CLINICAL DATA: Status post percutaneous catheter drainage of
sigmoid diverticular abscess on 11/24/2016.

EXAM:
CT ABDOMEN AND PELVIS WITH CONTRAST
TECHNIQUE: Multidetector CT imaging of the abdomen and pelvis was performed
using the standard protocol following bolus administration of
intravenous contrast.
CONTRAST:  125mL [id number] IOPAMIDOL ([id number]) INJECTION 61%

[Series 3: abd/pelvis with · axial · 0.91mm/px · z∈[-336,+9]mm · 8 of 89 slices shown, 13 images]
[im 10/89  soft-tissue]
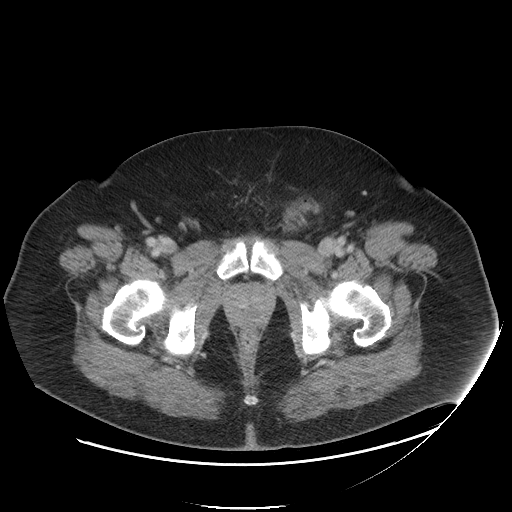
[im 10/89  bone]
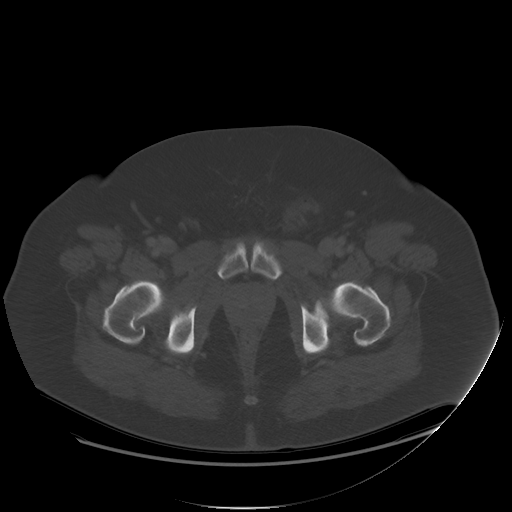
[im 20/89  soft-tissue]
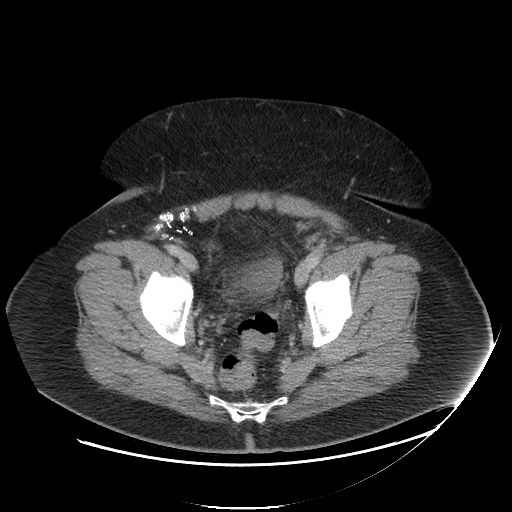
[im 30/89  soft-tissue]
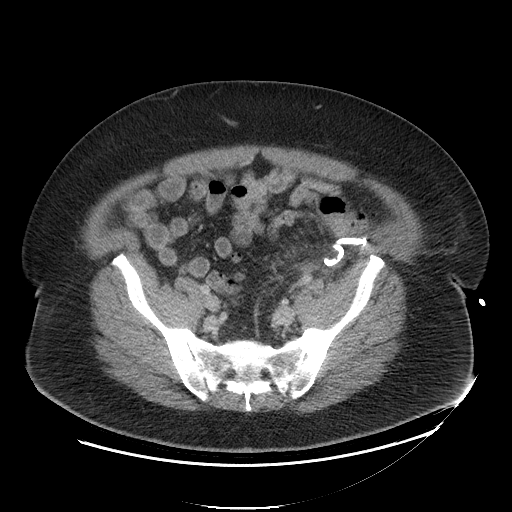
[im 40/89  soft-tissue]
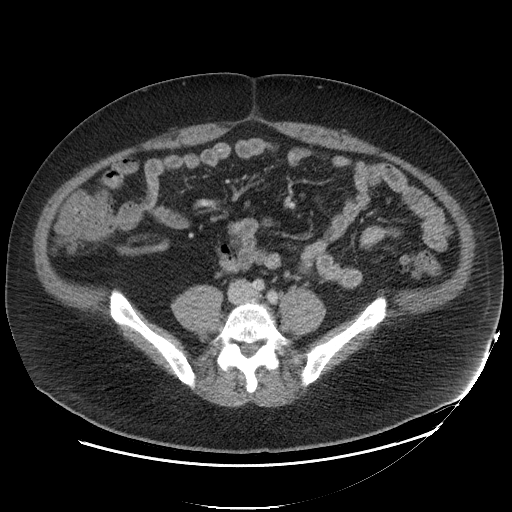
[im 49/89  soft-tissue]
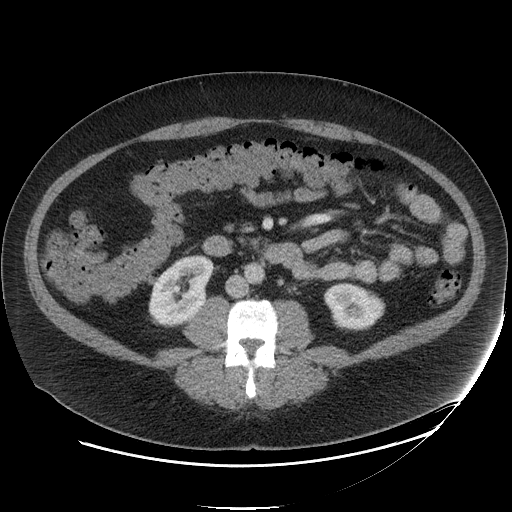
[im 49/89  lung]
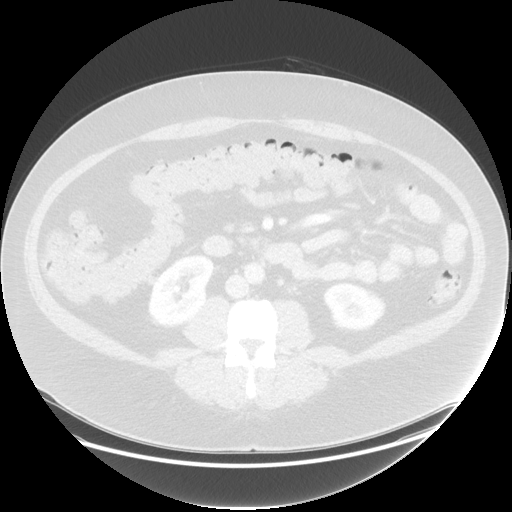
[im 59/89  soft-tissue]
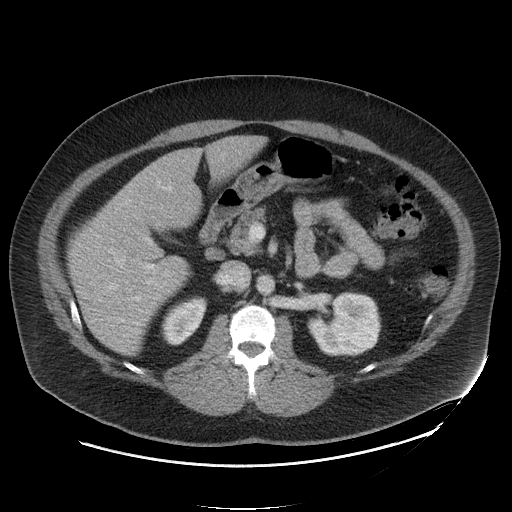
[im 59/89  lung]
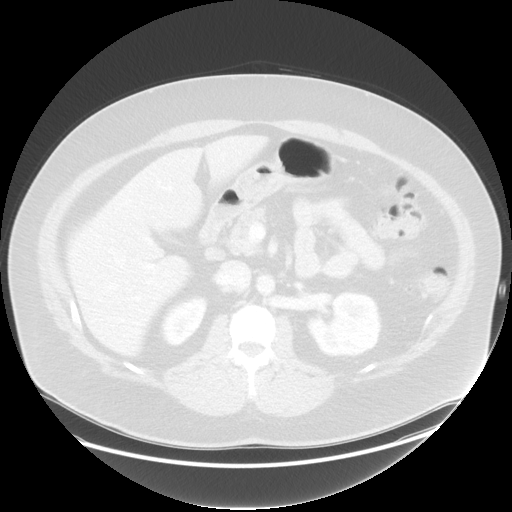
[im 69/89  soft-tissue]
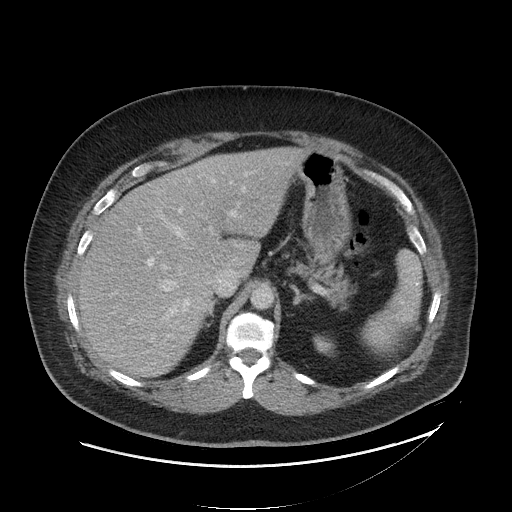
[im 69/89  lung]
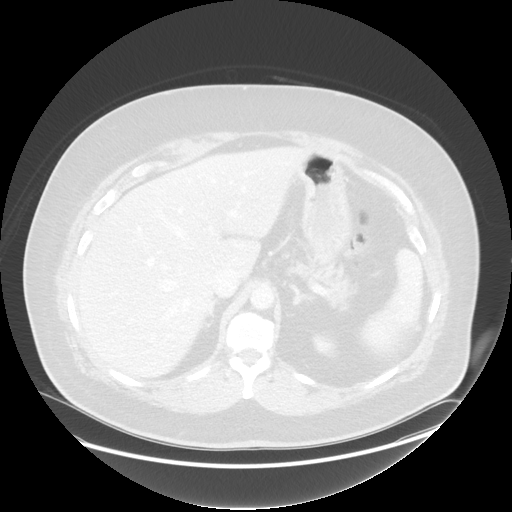
[im 79/89  soft-tissue]
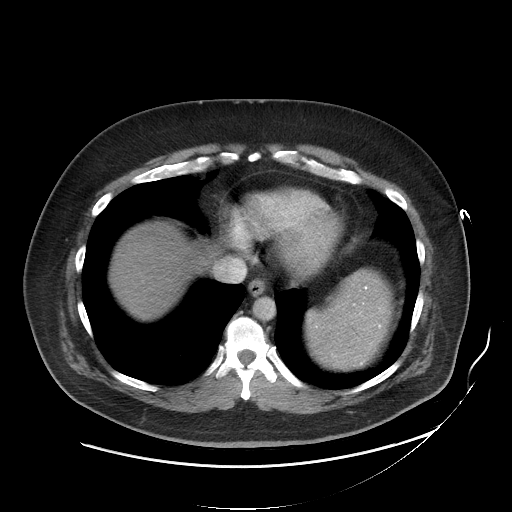
[im 79/89  lung]
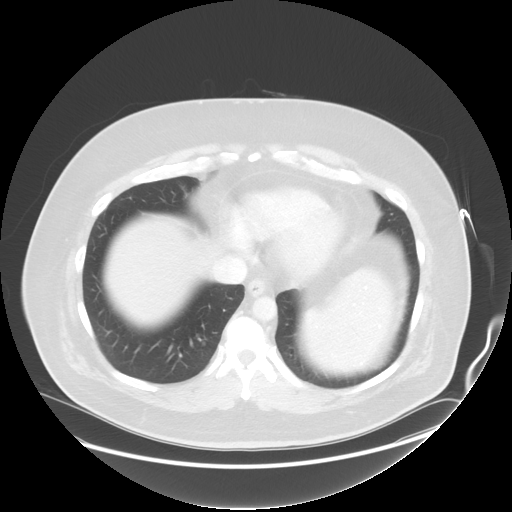

[Series 601: coronal body · coronal · 1.03mm/px · 1 of 142 slices shown, 2 images]
[im 48/142  soft-tissue]
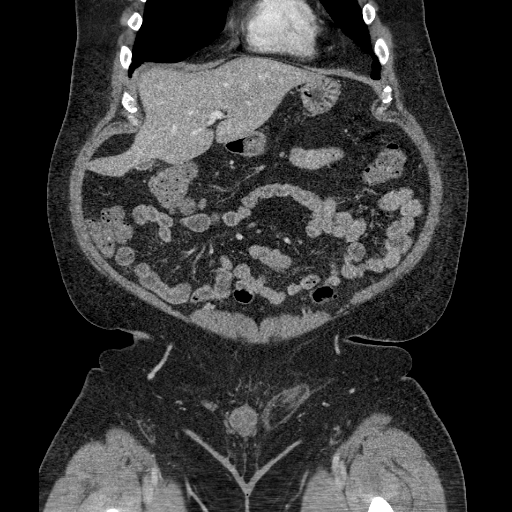
[im 48/142  bone]
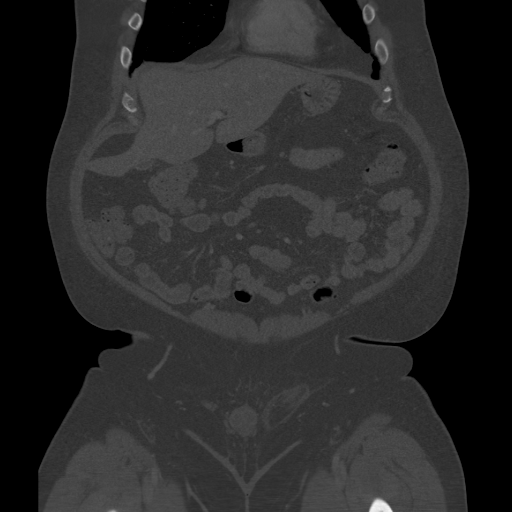

[9 of 36 positions shown; findings below may reference images not displayed]

FINDINGS: Lower chest: No acute abnormality.

Hepatobiliary: No focal liver abnormality is seen. No gallstones,
gallbladder wall thickening, or biliary dilatation.

Pancreas: Unremarkable. No pancreatic ductal dilatation or
surrounding inflammatory changes.

Spleen: Normal in size without focal abnormality.

Adrenals/Urinary Tract: Adrenal glands are unremarkable. Kidneys are
normal, without renal calculi, focal lesion, or hydronephrosis.
Bladder is unremarkable.

Stomach/Bowel: Bowel shows no evidence of obstruction or ileus.
Diverticular abscess lateral to the sigmoid colon has completely
resolved. Decrease in inflammation adjacent to the sigmoid colon. No
free intraperitoneal air.

Vascular/Lymphatic: No significant vascular findings are present. No
enlarged abdominal or pelvic lymph nodes.

Reproductive: Prostate is unremarkable.

Other: A left inguinal hernia is present containing fat and a small
amount of air which may relate to air being previously flushed
through the drainage catheter. No bowel extends into the inguinal
hernia. Right-sided hernia mesh is present without evidence of
recurrent right inguinal hernia.

Musculoskeletal: No acute or significant osseous findings.
IMPRESSION: 1. Resolved sigmoid diverticular abscess after percutaneous catheter
drainage.
2. Left inguinal hernia contains fat as well as a small amount of
air. This air may relate to air previously being introduced with
flushing of the left-sided percutaneous drain.
3. Right inguinal hernia repair with mesh without evidence of
recurrent right inguinal hernia.

## 2016-12-14 DIAGNOSIS — R109 Unspecified abdominal pain: Secondary | ICD-10-CM | POA: Diagnosis not present

## 2016-12-15 DIAGNOSIS — R109 Unspecified abdominal pain: Secondary | ICD-10-CM | POA: Diagnosis not present

## 2016-12-18 DIAGNOSIS — M7651 Patellar tendinitis, right knee: Secondary | ICD-10-CM | POA: Diagnosis not present

## 2016-12-18 DIAGNOSIS — M23303 Other meniscus derangements, unspecified medial meniscus, right knee: Secondary | ICD-10-CM | POA: Diagnosis not present

## 2016-12-18 DIAGNOSIS — M2241 Chondromalacia patellae, right knee: Secondary | ICD-10-CM | POA: Diagnosis not present

## 2016-12-18 DIAGNOSIS — M76891 Other specified enthesopathies of right lower limb, excluding foot: Secondary | ICD-10-CM | POA: Diagnosis not present

## 2016-12-21 ENCOUNTER — Ambulatory Visit: Payer: Self-pay | Admitting: Surgery

## 2016-12-21 DIAGNOSIS — K5732 Diverticulitis of large intestine without perforation or abscess without bleeding: Secondary | ICD-10-CM | POA: Diagnosis not present

## 2016-12-21 NOTE — H&P (Signed)
History of Present Illness Matthew Reid(Matthew Frie M. Levia Waltermire MD; 12/21/2016 11:26 AM) Patient words: CC: Here for f/u following admission to [facility] for perforated diverticulitis with large abscess 11/24/16  HPI: 56M with hx of HTN, DM (controlled on metformin), HLD presented with LLQ pain x12d. Numerous attacks of diverticulitis in the past - all managed with either inpt or outpt abx; never had a drain nor surgery. 1[place] was [place]x 6843yrs [place] in [place]. He was admitted to [facility] 11/24/16 with diverticulitis of the sigmoid with large pericolic abscess - 8.6cm in largest diameter.Underwent percutaneous drainage and IV abx - drain output was purulent. Subsequently improved and was discharged home. He completed his course of abx and saw IR back for a drain study.  On 10/30/1 he underwent a drain study which demonstrated no fistulous connection to the sigmoid colon.  The abscess cavity had resolved.  The drainage catheter was successfully removed.  Since that time he has been doing well.  He reports normal bowel function.  He denies fever/chills/abdominal pain.  He is tolerating a diet.    No prior cardiac history. He reports he works around the house and outside without issues. He can comfortably climb 2 flights of stairs without any shortness of breath.  He denies any chest pain with activity or rest.  PMH: DM well controlled on metformin; HLD  PSH: Lap umbilical hernia repair with mesh 1997.  Last cscope 6953yrs ago was reportedly normal per pt. Has a hx of colon polyps 5243yrs ago  FHx: Denies FHx of malignancy Social: Denies use of tobacco/EtOH/drugs FHx: Denies FHx of malignancy ROS: A comprehensive 10 system review of systems was completed and pertinent findings noted above.  The patient is a 56 year old male.   Past Surgical History (Matthew Reid, RMA; 12/21/2016 10:35 AM) Colon Polyp Removal - Colonoscopy   Tonsillectomy   Vasectomy    Diagnostic Studies History (Matthew Reid, RMA; 12/21/2016 10:35  AM) Colonoscopy   1-5 years ago  Medication History (Matthew Reid, RMA; 12/21/2016 10:40 AM) Atorvastatin Calcium  (20MG  Tablet, Oral) Active. MetFORMIN HCl  (1000MG  Tablet, Oral) Active. Metoprolol Succinate ER  (50MG  Tablet ER 24HR, Oral) Active. Aspirin  (81MG  Tablet, Oral) Active. Vitamin D (Cholecalciferol)  (1000UNIT Capsule, Oral) Active. Medications Reconciled   Social History (Matthew Reid, RMA; 12/21/2016 10:35 AM) Alcohol use   Occasional alcohol use. Caffeine use   Coffee. Illicit drug use   Prefer to discuss with provider.  Family History (Matthew Reid, RMA; 12/21/2016 10:35 AM) Diabetes Mellitus   Brother.  Other Problems (Matthew Reid, RMA; 12/21/2016 10:35 AM) Diabetes Mellitus   Diverticulosis   Hypercholesterolemia    Note: NKDA    Review of Systems Matthew Reid(Matthew Goodell M. Zailey Audia MD; 12/21/2016 11:18 AM) General Not Present- Appetite Loss, Chills, Fatigue, Fever, Night Sweats, Weight Gain and Weight Loss. Skin Not Present- Change in Wart/Mole, Dryness, Hives, Jaundice, New Lesions, Non-Healing Wounds, Rash and Ulcer. HEENT Present- Wears glasses/contact lenses. Not Present- Earache, Hearing Loss, Hoarseness, Nose Bleed, Oral Ulcers, Ringing in the Ears, Seasonal Allergies, Sinus Pain, Sore Throat, Visual Disturbances and Yellow Eyes. Respiratory Not Present- Bloody sputum, Chronic Cough, Difficulty Breathing, Snoring and Wheezing. Breast Not Present- Breast Mass, Breast Pain, Nipple Discharge and Skin Changes. Cardiovascular Present- Palpitations. Not Present- Chest Pain, Difficulty Breathing Lying Down, Leg Cramps, Rapid Heart Rate, Shortness of Breath and Swelling of Extremities. Gastrointestinal Present- Constipation. Not Present- Abdominal Pain, Bloating, Bloody Stool, Change in Bowel Habits, Chronic diarrhea, Difficulty Swallowing,  Excessive gas, Gets full quickly at meals, Hemorrhoids, Indigestion, Nausea, Rectal Pain and Vomiting. Male Genitourinary  Not Present- Blood in Urine, Change in Urinary Stream, Frequency, Impotence, Nocturia, Painful Urination, Urgency and Urine Leakage. Musculoskeletal Present- Joint Pain. Not Present- Joint Swelling. Neurological Not Present- Decreased Memory and Difficulty Speaking. Psychiatric Not Present- Anxiety and Depression. Hematology Not Present- Abnormal Bleeding and Blood Thinners.  Vitals (Matthew Reid RMA; 12/21/2016 10:41 AM) 12/21/2016 10:41 AM Weight: 233.8 lb   Height: 67 in  Body Surface Area: 2.16 m   Body Mass Index: 36.62 kg/m   Temp.: 97.5 F    Pulse: 84 (Regular)    BP: 112/72 (Sitting, Left Arm, Standard)       Physical Exam Matthew Reid(Matthew Fredin M. Kyarah Enamorado MD; 12/21/2016 11:19 AM) The physical exam findings are as follows: Note: Constitutional: No acute distress, conversant, no deformities Eyes: Moist conjunctiva; no lid lag; anicteric; pupils equal round and reactive to light Neck: Trachea midline; no palpable thyromegaly Lungs: Normal respiratory effort; no tactile fremitus CV: Regular rate and rhythm; no palpable thrill; no pitting edema GI: Abdomen soft, nontender, nondistended; no palpable hepatosplenomegaly. Drain site well healed MSK: Normal gait; no clubbing/cyanosis Psych: Appropriate affect; alert and oriented 3 Lymphatic: No palpable cervical or axillary lymphadenopathy    Assessment & Plan Matthew Reid(Matthew Rocha M. Nashawn Hillock MD; 12/21/2016 11:25 AM) SIGMOID DIVERTICULITIS (K57.32) Impression: [name] is a very pleasant 56yoM with recent hx of multiple attacks of sigmoid diverticulitis, most recently with large abscess requiring percutaneous drainage - here for follow-up -The anatomy and physiology of the GI tract was discussed at length. The pathophysiology of diverticulitis in the natural course of this disease was discussed using pictures and diagrams. The planned procedure, material risks (included but not limited to pain, bleeding, infection, scarring, recurrence, leak,  damage to surrounding structures including viscous/ureter/bladder, need for additional procedures, permanent stoma, hernia, heart attack, stroke, death) benefits and alternatives were discussed. Additionally we discussed the preoperative and postoperative process as well as recovery from surgery. His questions were answered to his satisfaction. And he elected to proceed with surgery. -We'll schedule for laparoscopic versus open sigmoid colectomy with cystoscopy/stents by urology, intraoperative flexible sigmoidoscopy, possible stoma, all other indicated procedures. This will be no sooner than the last week of November. He is up to date on his colonoscopy and so does not need to have that repeated at this time.  Signed electronically by Matthew Meusehristopher M October Peery, MD (12/21/2016 11:27 AM)

## 2016-12-23 ENCOUNTER — Other Ambulatory Visit: Payer: Self-pay | Admitting: Urology

## 2016-12-29 NOTE — Patient Instructions (Addendum)
Matthew ShileyDavid Reid  12/29/2016   Your procedure is scheduled on: 01-04-17   Report to [facility]  Entrance [place] to 3rd floor to  Short Stay Center at 5:30 AM.   Call this number if you have problems the morning of surgery [phone]    Remember: ONLY 1 PERSON MAY GO WITH YOU TO SHORT STAY TO GET  READY MORNING OF YOUR SURGERY.  Do not eat food or drink liquids :After Midnight.              Please eat a Clear Liquid Diet on the day of prep to prevent dehydration.     CLEAR LIQUID DIET   Foods Allowed                                                                     Foods Excluded  Coffee and tea, regular and decaf                             liquids that you cannot  Plain Jell-O in any flavor                                             see through such as: Fruit ices (not with fruit pulp)                                     milk, soups, orange juice  Iced Popsicles                                    All solid food Carbonated beverages, regular and diet                                    Cranberry, grape and apple juices Sports drinks like Gatorade Lightly seasoned clear broth or consume(fat free) Sugar, honey syrup  Sample Menu Breakfast                                Lunch                                     Supper Cranberry juice                    Beef broth                            Chicken broth Jell-O  Grape juice                           Apple juice Coffee or tea                        Jell-O                                      Popsicle                                                Coffee or tea                        Coffee or tea  _____________________________________________________________________     Take these medicines the morning of surgery with A SIP OF WATER: None per pt's preference.  DO NOT TAKE ANY DIABETIC MEDICATIONS DAY OF YOUR SURGERY                               You  may not have any metal on your body including hair pins and              piercings  Do not wear jewelry, make-up, lotions, powders or deodorant             Men may shave face and neck.   Do not bring valuables to the hospital. [facility] IS NOT             RESPONSIBLE   FOR VALUABLES.  Contacts, dentures or bridgework may not be worn into surgery.  Leave suitcase in the car. After surgery it may be brought to your room.                 Please read over the following fact sheets you were given: _____________________________________________________________________  How to Manage Your Diabetes Before and After Surgery  Why is it important to control my blood sugar before and after surgery? . Improving blood sugar levels before and after surgery helps healing and can limit problems. . A way of improving blood sugar control is eating a healthy diet by: o  Eating less sugar and carbohydrates o  Increasing activity/exercise o  Talking with your doctor about reaching your blood sugar goals . High blood sugars (greater than 180 mg/dL) can raise your risk of infections and slow your recovery, so you will need to focus on controlling your diabetes during the weeks before surgery. . Make sure that the doctor who takes care of your diabetes knows about your planned surgery including the date and location.  How do I manage my blood sugar before surgery? . Check your blood sugar at least 4 times a day, starting 2 days before surgery, to make sure that the level is not too high or low. o Check your blood sugar the morning of your surgery when you wake up and every 2 hours until you get to the Short Stay unit. . If your blood sugar is less than 70 mg/dL, you will need to treat for low blood sugar: o Do not take insulin. o Treat a low blood sugar (less than  70 mg/dL) with  cup of clear juice (cranberry or apple), 4 glucose tablets, OR glucose gel. o Recheck blood sugar in 15 minutes after treatment  (to make sure it is greater than 70 mg/dL). If your blood sugar is not greater than 70 mg/dL on recheck, call [phone] for further instructions. . Report your blood sugar to the short stay nurse when you get to Short Stay.  . If you are admitted to the hospital after surgery: o Your blood sugar will be checked by the staff and you will probably be given insulin after surgery (instead of oral diabetes medicines) to make sure you have good blood sugar levels. o The goal for blood sugar control after surgery is 80-180 mg/dL.   WHAT DO I DO ABOUT MY DIABETES MEDICATION?  Marland Kitchen. Do not take oral diabetes medicines (pills) the morning of surgery.  . THE DAY BEFORE SURGERY, take your usual dose of Metfdormin       Patient Signature:  Date:   Nurse Signature:  Date:   Reviewed and Endorsed by [facility] Patient Education Committee, August 2015           [facility] - Preparing for Surgery Before surgery, you can play an important role.  Because skin is not sterile, your skin needs to be as free of germs as possible.  You can reduce the number of germs on your skin by washing with CHG (chlorahexidine gluconate) soap before surgery.  CHG is an antiseptic cleaner which kills germs and bonds with the skin to continue killing germs even after washing. Please DO NOT use if you have an allergy to CHG or antibacterial soaps.  If your skin becomes reddened/irritated stop using the CHG and inform your nurse when you arrive at Short Stay. Do not shave (including legs and underarms) for at least 48 hours prior to the first CHG shower.  You may shave your face/neck. Please follow these instructions carefully:  1.  Shower with CHG Soap the night before surgery and the  morning of Surgery.  2.  If you choose to wash your hair, wash your hair first as usual with your  normal  shampoo.  3.  After you shampoo, rinse your hair and body thoroughly to remove the  shampoo.                           4.  Use CHG as you  would any other liquid soap.  You can apply chg directly  to the skin and wash                       Gently with a scrungie or clean washcloth.  5.  Apply the CHG Soap to your body ONLY FROM THE NECK DOWN.   Do not use on face/ open                           Wound or open sores. Avoid contact with eyes, ears mouth and genitals (private parts).                       Wash face,  Genitals (private parts) with your normal soap.             6.  Wash thoroughly, paying special attention to the area where your surgery  will be performed.  7.  Thoroughly rinse your body  with warm water from the neck down.  8.  DO NOT shower/wash with your normal soap after using and rinsing off  the CHG Soap.                9.  Pat yourself dry with a clean towel.            10.  Wear clean pajamas.            11.  Place clean sheets on your bed the night of your first shower and do not  sleep with pets. Day of Surgery : Do not apply any lotions/deodorants the morning of surgery.  Please wear clean clothes to the hospital/surgery center.  FAILURE TO FOLLOW THESE INSTRUCTIONS MAY RESULT IN THE CANCELLATION OF YOUR SURGERY PATIENT SIGNATURE_________________________________  NURSE SIGNATURE__________________________________  ________________________________________________________________________

## 2016-12-29 NOTE — Progress Notes (Signed)
11-25-16 (Epic) EKG  12-08-16 (Epic) CT Abd/Pelvis

## 2016-12-30 ENCOUNTER — Encounter (HOSPITAL_COMMUNITY)
Admission: RE | Admit: 2016-12-30 | Discharge: 2016-12-30 | Disposition: A | Discharge status: Home / Self Care | Payer: BLUE CROSS/BLUE SHIELD | Source: Ambulatory Visit | Attending: Surgery | Admitting: Surgery

## 2016-12-30 ENCOUNTER — Encounter (INDEPENDENT_AMBULATORY_CARE_PROVIDER_SITE_OTHER): Payer: Self-pay

## 2016-12-30 ENCOUNTER — Other Ambulatory Visit: Payer: Self-pay

## 2016-12-30 ENCOUNTER — Encounter: Payer: Self-pay | Admitting: Interventional Radiology

## 2016-12-30 DIAGNOSIS — K5792 Diverticulitis of intestine, part unspecified, without perforation or abscess without bleeding: Secondary | ICD-10-CM | POA: Diagnosis not present

## 2016-12-30 DIAGNOSIS — Z0183 Encounter for blood typing: Secondary | ICD-10-CM | POA: Diagnosis not present

## 2016-12-30 DIAGNOSIS — Z01812 Encounter for preprocedural laboratory examination: Secondary | ICD-10-CM | POA: Diagnosis not present

## 2016-12-30 LAB — COMPREHENSIVE METABOLIC PANEL
ALBUMIN: 4.6 g/dL (ref 3.5–5.0)
ALK PHOS: 72 U/L (ref 38–126)
ALT: 40 U/L (ref 17–63)
ANION GAP: 11 (ref 5–15)
AST: 30 U/L (ref 15–41)
BUN: 18 mg/dL (ref 6–20)
CALCIUM: 9.4 mg/dL (ref 8.9–10.3)
CO2: 24 mmol/L (ref 22–32)
Chloride: 103 mmol/L (ref 101–111)
Creatinine, Ser: 0.96 mg/dL (ref 0.61–1.24)
GFR calc Af Amer: >60 mL/min (ref >60)
GFR calc non Af Amer: >60 mL/min (ref >60)
GLUCOSE: 95 mg/dL (ref 65–99)
Potassium: 3.9 mmol/L (ref 3.5–5.1)
SODIUM: 138 mmol/L (ref 135–145)
Total Bilirubin: 0.9 mg/dL (ref 0.3–1.2)
Total Protein: 7.9 g/dL (ref 6.5–8.1)

## 2016-12-30 LAB — GLUCOSE, CAPILLARY: GLUCOSE-CAPILLARY: 105 mg/dL — AB (ref 65–99)

## 2016-12-30 LAB — CBC WITH DIFFERENTIAL/PLATELET
BASOS ABS: 0 10*3/uL (ref 0.0–0.1)
BASOS PCT: 0 %
EOS ABS: 0.2 10*3/uL (ref 0.0–0.7)
Eosinophils Relative: 2 %
HCT: 44.8 % (ref 39.0–52.0)
HEMOGLOBIN: 14.7 g/dL (ref 13.0–17.0)
Lymphocytes Relative: 22 %
Lymphs Abs: 2 10*3/uL (ref 0.7–4.0)
MCH: 29.9 pg (ref 26.0–34.0)
MCHC: 32.8 g/dL (ref 30.0–36.0)
MCV: 91.1 fL (ref 78.0–100.0)
Monocytes Absolute: 0.7 10*3/uL (ref 0.1–1.0)
Monocytes Relative: 8 %
NEUTROS PCT: 68 %
Neutro Abs: 6.2 10*3/uL (ref 1.7–7.7)
Platelets: 271 10*3/uL (ref 150–400)
RBC: 4.92 MIL/uL (ref 4.22–5.81)
RDW: 14.2 % (ref 11.5–15.5)
WBC: 9.1 10*3/uL (ref 4.0–10.5)

## 2016-12-30 LAB — HEMOGLOBIN A1C
Hgb A1c MFr Bld: 6 % — ABNORMAL HIGH (ref 4.8–5.6)
Mean Plasma Glucose: 125.5 mg/dL

## 2016-12-30 LAB — ABO/RH: ABO/RH(D): O NEG

## 2017-01-04 ENCOUNTER — Encounter (HOSPITAL_COMMUNITY)
Admission: RE | Disposition: A | Discharge status: Home / Self Care | Payer: Self-pay | Source: Ambulatory Visit | Attending: Surgery

## 2017-01-04 ENCOUNTER — Inpatient Hospital Stay (HOSPITAL_COMMUNITY): Payer: BLUE CROSS/BLUE SHIELD

## 2017-01-04 ENCOUNTER — Inpatient Hospital Stay (HOSPITAL_COMMUNITY): Payer: BLUE CROSS/BLUE SHIELD | Admitting: Anesthesiology

## 2017-01-04 ENCOUNTER — Inpatient Hospital Stay (HOSPITAL_COMMUNITY)
Admission: RE | Admit: 2017-01-04 | Discharge: 2017-01-08 | DRG: 331 | Disposition: A | Discharge status: Home / Self Care | Payer: BLUE CROSS/BLUE SHIELD | Source: Ambulatory Visit | Attending: Surgery | Admitting: Surgery

## 2017-01-04 ENCOUNTER — Other Ambulatory Visit: Payer: Self-pay

## 2017-01-04 ENCOUNTER — Encounter (HOSPITAL_COMMUNITY): Payer: Self-pay | Admitting: Emergency Medicine

## 2017-01-04 DIAGNOSIS — E7800 Pure hypercholesterolemia, unspecified: Secondary | ICD-10-CM | POA: Diagnosis present

## 2017-01-04 DIAGNOSIS — Z86010 Personal history of colonic polyps: Secondary | ICD-10-CM | POA: Diagnosis not present

## 2017-01-04 DIAGNOSIS — K5730 Diverticulosis of large intestine without perforation or abscess without bleeding: Secondary | ICD-10-CM | POA: Diagnosis not present

## 2017-01-04 DIAGNOSIS — R12 Heartburn: Secondary | ICD-10-CM | POA: Diagnosis not present

## 2017-01-04 DIAGNOSIS — E119 Type 2 diabetes mellitus without complications: Secondary | ICD-10-CM | POA: Diagnosis present

## 2017-01-04 DIAGNOSIS — K5732 Diverticulitis of large intestine without perforation or abscess without bleeding: Secondary | ICD-10-CM | POA: Diagnosis not present

## 2017-01-04 DIAGNOSIS — Z7984 Long term (current) use of oral hypoglycemic drugs: Secondary | ICD-10-CM

## 2017-01-04 DIAGNOSIS — E785 Hyperlipidemia, unspecified: Secondary | ICD-10-CM | POA: Diagnosis present

## 2017-01-04 DIAGNOSIS — I10 Essential (primary) hypertension: Secondary | ICD-10-CM | POA: Diagnosis not present

## 2017-01-04 DIAGNOSIS — Z6836 Body mass index (BMI) 36.0-36.9, adult: Secondary | ICD-10-CM

## 2017-01-04 DIAGNOSIS — K5792 Diverticulitis of intestine, part unspecified, without perforation or abscess without bleeding: Secondary | ICD-10-CM | POA: Diagnosis not present

## 2017-01-04 DIAGNOSIS — Z302 Encounter for sterilization: Secondary | ICD-10-CM | POA: Diagnosis not present

## 2017-01-04 DIAGNOSIS — Z87891 Personal history of nicotine dependence: Secondary | ICD-10-CM

## 2017-01-04 DIAGNOSIS — E1139 Type 2 diabetes mellitus with other diabetic ophthalmic complication: Secondary | ICD-10-CM | POA: Diagnosis not present

## 2017-01-04 HISTORY — PX: LAPAROSCOPIC SIGMOID COLECTOMY: SHX5928

## 2017-01-04 HISTORY — PX: CYSTOSCOPY WITH STENT PLACEMENT: SHX5790

## 2017-01-04 LAB — TYPE AND SCREEN
ABO/RH(D): O NEG
Antibody Screen: NEGATIVE

## 2017-01-04 LAB — CBC
HCT: 45.8 % (ref 39.0–52.0)
Hemoglobin: 14.9 g/dL (ref 13.0–17.0)
MCH: 30 pg (ref 26.0–34.0)
MCHC: 32.5 g/dL (ref 30.0–36.0)
MCV: 92.2 fL (ref 78.0–100.0)
PLATELETS: 310 10*3/uL (ref 150–400)
RBC: 4.97 MIL/uL (ref 4.22–5.81)
RDW: 14.4 % (ref 11.5–15.5)
WBC: 15.4 10*3/uL — AB (ref 4.0–10.5)

## 2017-01-04 LAB — GLUCOSE, CAPILLARY
GLUCOSE-CAPILLARY: 180 mg/dL — AB (ref 65–99)
Glucose-Capillary: 180 mg/dL — ABNORMAL HIGH (ref 65–99)
Glucose-Capillary: 150 mg/dL — ABNORMAL HIGH (ref 65–99)
Glucose-Capillary: 204 mg/dL — ABNORMAL HIGH (ref 65–99)

## 2017-01-04 LAB — CREATININE, SERUM
CREATININE: 1.28 mg/dL — AB (ref 0.61–1.24)
GFR calc non Af Amer: >60 mL/min (ref >60)

## 2017-01-04 SURGERY — COLECTOMY, SIGMOID, LAPAROSCOPIC
Anesthesia: General | Site: Abdomen

## 2017-01-04 MED ORDER — CHLORHEXIDINE GLUCONATE CLOTH 2 % EX PADS: 6.0000 | MEDICATED_PAD | Freq: Once | CUTANEOUS | Status: DC

## 2017-01-04 MED ORDER — DIPHENHYDRAMINE HCL 12.5 MG/5ML PO ELIX: 12.5000 mg | ORAL_SOLUTION | Freq: Four times a day (QID) | ORAL | Status: DC | PRN

## 2017-01-04 MED ORDER — DEXAMETHASONE SODIUM PHOSPHATE 10 MG/ML IJ SOLN
INTRAMUSCULAR | Status: DC | PRN
  Administered 2017-01-04: 10 mg via INTRAVENOUS

## 2017-01-04 MED ORDER — POLYETHYLENE GLYCOL 3350 17 GM/SCOOP PO POWD
1.0000 | Freq: Once | ORAL | Status: DC
  Filled 2017-01-04: qty 255

## 2017-01-04 MED ORDER — SUGAMMADEX SODIUM 500 MG/5ML IV SOLN
INTRAVENOUS | Status: AC
  Filled 2017-01-04: qty 5

## 2017-01-04 MED ORDER — BUPIVACAINE-EPINEPHRINE 0.25% -1:200000 IJ SOLN: INTRAMUSCULAR | Status: DC | PRN

## 2017-01-04 MED ORDER — LIDOCAINE 2% (20 MG/ML) 5 ML SYRINGE
INTRAMUSCULAR | Status: AC
  Filled 2017-01-04: qty 5

## 2017-01-04 MED ORDER — SCOPOLAMINE 1 MG/3DAYS TD PT72
MEDICATED_PATCH | TRANSDERMAL | Status: AC
  Filled 2017-01-04: qty 1

## 2017-01-04 MED ORDER — OXYCODONE HCL 5 MG PO TABS: 5.0000 mg | ORAL_TABLET | Freq: Four times a day (QID) | ORAL | Status: DC | PRN

## 2017-01-04 MED ORDER — ROCURONIUM BROMIDE 50 MG/5ML IV SOSY
PREFILLED_SYRINGE | INTRAVENOUS | Status: AC
  Filled 2017-01-04: qty 5

## 2017-01-04 MED ORDER — ALVIMOPAN 12 MG PO CAPS
12.0000 mg | ORAL_CAPSULE | Freq: Two times a day (BID) | ORAL | Status: DC
  Administered 2017-01-05 – 2017-01-06 (×3): 12 mg via ORAL
  Filled 2017-01-04 (×3): qty 1

## 2017-01-04 MED ORDER — CEFOTETAN DISODIUM-DEXTROSE 2-2.08 GM-%(50ML) IV SOLR
2.0000 g | INTRAVENOUS | Status: AC
  Administered 2017-01-04: 2 g via INTRAVENOUS

## 2017-01-04 MED ORDER — BUPIVACAINE-EPINEPHRINE 0.25% -1:200000 IJ SOLN
INTRAMUSCULAR | Status: AC
  Filled 2017-01-04: qty 1

## 2017-01-04 MED ORDER — HYDROXYZINE HCL 10 MG PO TABS
10.0000 mg | ORAL_TABLET | Freq: Four times a day (QID) | ORAL | Status: DC | PRN
  Filled 2017-01-04: qty 1

## 2017-01-04 MED ORDER — SCOPOLAMINE 1 MG/3DAYS TD PT72
MEDICATED_PATCH | TRANSDERMAL | Status: DC | PRN
  Administered 2017-01-04: 1 via TRANSDERMAL

## 2017-01-04 MED ORDER — ALBUTEROL SULFATE HFA 108 (90 BASE) MCG/ACT IN AERS: 2.0000 | INHALATION_SPRAY | Freq: Four times a day (QID) | RESPIRATORY_TRACT | Status: DC | PRN

## 2017-01-04 MED ORDER — ONDANSETRON HCL 4 MG/2ML IJ SOLN: 4.0000 mg | Freq: Four times a day (QID) | INTRAMUSCULAR | Status: DC | PRN

## 2017-01-04 MED ORDER — SODIUM CHLORIDE 0.9% FLUSH: 9.0000 mL | INTRAVENOUS | Status: DC | PRN

## 2017-01-04 MED ORDER — HEPARIN SODIUM (PORCINE) 5000 UNIT/ML IJ SOLN
5000.0000 [IU] | Freq: Three times a day (TID) | INTRAMUSCULAR | Status: DC
  Administered 2017-01-04 – 2017-01-08 (×11): 5000 [IU] via SUBCUTANEOUS
  Filled 2017-01-04 (×11): qty 1

## 2017-01-04 MED ORDER — FENTANYL CITRATE (PF) 100 MCG/2ML IJ SOLN
INTRAMUSCULAR | Status: AC
  Filled 2017-01-04: qty 2

## 2017-01-04 MED ORDER — CEFOTETAN DISODIUM-DEXTROSE 2-2.08 GM-%(50ML) IV SOLR
INTRAVENOUS | Status: AC
  Filled 2017-01-04: qty 50

## 2017-01-04 MED ORDER — ATORVASTATIN CALCIUM 20 MG PO TABS
20.0000 mg | ORAL_TABLET | Freq: Every day | ORAL | Status: DC
Start: 2017-01-05 — End: 2017-01-08
  Administered 2017-01-05 – 2017-01-08 (×4): 20 mg via ORAL
  Filled 2017-01-04 (×4): qty 1

## 2017-01-04 MED ORDER — MIDAZOLAM HCL 2 MG/2ML IJ SOLN
INTRAMUSCULAR | Status: AC
  Filled 2017-01-04: qty 2

## 2017-01-04 MED ORDER — PHENYLEPHRINE 40 MCG/ML (10ML) SYRINGE FOR IV PUSH (FOR BLOOD PRESSURE SUPPORT)
PREFILLED_SYRINGE | INTRAVENOUS | Status: DC | PRN
  Administered 2017-01-04: 80 ug via INTRAVENOUS

## 2017-01-04 MED ORDER — PHENYLEPHRINE 40 MCG/ML (10ML) SYRINGE FOR IV PUSH (FOR BLOOD PRESSURE SUPPORT)
PREFILLED_SYRINGE | INTRAVENOUS | Status: AC
  Filled 2017-01-04: qty 10

## 2017-01-04 MED ORDER — HYDROMORPHONE HCL 1 MG/ML IJ SOLN
0.2500 mg | INTRAMUSCULAR | Status: DC | PRN
  Administered 2017-01-04: 0.25 mg via INTRAVENOUS
  Administered 2017-01-04: 0.5 mg via INTRAVENOUS
  Administered 2017-01-04: 0.25 mg via INTRAVENOUS

## 2017-01-04 MED ORDER — DIPHENHYDRAMINE HCL 50 MG/ML IJ SOLN
12.5000 mg | Freq: Four times a day (QID) | INTRAMUSCULAR | Status: DC | PRN
  Administered 2017-01-05: 12.5 mg via INTRAVENOUS
  Filled 2017-01-04: qty 1

## 2017-01-04 MED ORDER — NALOXONE HCL 0.4 MG/ML IJ SOLN: 0.4000 mg | INTRAMUSCULAR | Status: DC | PRN

## 2017-01-04 MED ORDER — HYDROMORPHONE HCL 1 MG/ML IJ SOLN
INTRAMUSCULAR | Status: AC
  Administered 2017-01-04: 0.5 mg via INTRAVENOUS
  Filled 2017-01-04: qty 1

## 2017-01-04 MED ORDER — ONDANSETRON HCL 4 MG PO TABS: 4.0000 mg | ORAL_TABLET | Freq: Four times a day (QID) | ORAL | Status: DC | PRN

## 2017-01-04 MED ORDER — GABAPENTIN 300 MG PO CAPS
300.0000 mg | ORAL_CAPSULE | ORAL | Status: AC
  Administered 2017-01-04: 300 mg via ORAL
  Filled 2017-01-04: qty 1

## 2017-01-04 MED ORDER — ASPIRIN EC 81 MG PO TBEC
81.0000 mg | DELAYED_RELEASE_TABLET | Freq: Every evening | ORAL | Status: DC
  Administered 2017-01-04 – 2017-01-07 (×4): 81 mg via ORAL
  Filled 2017-01-04 (×4): qty 1

## 2017-01-04 MED ORDER — PROPOFOL 10 MG/ML IV BOLUS
INTRAVENOUS | Status: DC | PRN
  Administered 2017-01-04: 200 mg via INTRAVENOUS

## 2017-01-04 MED ORDER — ALVIMOPAN 12 MG PO CAPS
12.0000 mg | ORAL_CAPSULE | ORAL | Status: AC
  Administered 2017-01-04: 12 mg via ORAL
  Filled 2017-01-04: qty 1

## 2017-01-04 MED ORDER — INSULIN ASPART 100 UNIT/ML ~~LOC~~ SOLN
0.0000 [IU] | Freq: Three times a day (TID) | SUBCUTANEOUS | Status: DC
  Administered 2017-01-04: 3 [IU] via SUBCUTANEOUS
  Administered 2017-01-05 – 2017-01-06 (×3): 2 [IU] via SUBCUTANEOUS

## 2017-01-04 MED ORDER — SUCCINYLCHOLINE CHLORIDE 200 MG/10ML IV SOSY
PREFILLED_SYRINGE | INTRAVENOUS | Status: AC
  Filled 2017-01-04: qty 10

## 2017-01-04 MED ORDER — FENTANYL CITRATE (PF) 100 MCG/2ML IJ SOLN
INTRAMUSCULAR | Status: DC | PRN
  Administered 2017-01-04 (×3): 50 ug via INTRAVENOUS
  Administered 2017-01-04: 100 ug via INTRAVENOUS
  Administered 2017-01-04 (×3): 50 ug via INTRAVENOUS

## 2017-01-04 MED ORDER — METOPROLOL SUCCINATE ER 50 MG PO TB24
50.0000 mg | ORAL_TABLET | Freq: Every evening | ORAL | Status: DC
  Administered 2017-01-04 – 2017-01-07 (×4): 50 mg via ORAL
  Filled 2017-01-04 (×4): qty 1

## 2017-01-04 MED ORDER — SUCCINYLCHOLINE CHLORIDE 200 MG/10ML IV SOSY
PREFILLED_SYRINGE | INTRAVENOUS | Status: DC | PRN
  Administered 2017-01-04: 120 mg via INTRAVENOUS

## 2017-01-04 MED ORDER — ALUM & MAG HYDROXIDE-SIMETH 200-200-20 MG/5ML PO SUSP
30.0000 mL | Freq: Four times a day (QID) | ORAL | Status: DC | PRN
  Administered 2017-01-04: 30 mL via ORAL
  Filled 2017-01-04: qty 30

## 2017-01-04 MED ORDER — PROPOFOL 10 MG/ML IV BOLUS
INTRAVENOUS | Status: AC
  Filled 2017-01-04: qty 20

## 2017-01-04 MED ORDER — ONDANSETRON HCL 4 MG/2ML IJ SOLN
INTRAMUSCULAR | Status: DC | PRN
  Administered 2017-01-04: 4 mg via INTRAVENOUS

## 2017-01-04 MED ORDER — MIDAZOLAM HCL 5 MG/5ML IJ SOLN
INTRAMUSCULAR | Status: DC | PRN
  Administered 2017-01-04: 2 mg via INTRAVENOUS

## 2017-01-04 MED ORDER — LIDOCAINE 2% (20 MG/ML) 5 ML SYRINGE
INTRAMUSCULAR | Status: DC | PRN
  Administered 2017-01-04: 1.5 mg/kg/h via INTRAVENOUS

## 2017-01-04 MED ORDER — OXYCODONE HCL 5 MG PO TABS: 5.0000 mg | ORAL_TABLET | Freq: Once | ORAL | Status: DC | PRN

## 2017-01-04 MED ORDER — METRONIDAZOLE 500 MG PO TABS
1000.0000 mg | ORAL_TABLET | ORAL | Status: DC
  Filled 2017-01-04: qty 2

## 2017-01-04 MED ORDER — ONDANSETRON HCL 4 MG/2ML IJ SOLN
INTRAMUSCULAR | Status: AC
  Filled 2017-01-04: qty 2

## 2017-01-04 MED ORDER — KETAMINE HCL 10 MG/ML IJ SOLN
INTRAMUSCULAR | Status: DC | PRN
  Administered 2017-01-04: 30 mg via INTRAVENOUS
  Administered 2017-01-04 (×2): 20 mg via INTRAVENOUS

## 2017-01-04 MED ORDER — ALBUTEROL SULFATE (2.5 MG/3ML) 0.083% IN NEBU: 2.5000 mg | INHALATION_SOLUTION | Freq: Four times a day (QID) | RESPIRATORY_TRACT | Status: DC | PRN

## 2017-01-04 MED ORDER — HEPARIN SODIUM (PORCINE) 5000 UNIT/ML IJ SOLN
5000.0000 [IU] | Freq: Once | INTRAMUSCULAR | Status: AC
  Administered 2017-01-04: 5000 [IU] via SUBCUTANEOUS
  Filled 2017-01-04: qty 1

## 2017-01-04 MED ORDER — STERILE WATER FOR IRRIGATION IR SOLN
Status: DC | PRN
  Administered 2017-01-04: 1000 mL

## 2017-01-04 MED ORDER — HYDRALAZINE HCL 20 MG/ML IJ SOLN: 10.0000 mg | INTRAMUSCULAR | Status: DC | PRN

## 2017-01-04 MED ORDER — ACETAMINOPHEN 325 MG PO TABS
650.0000 mg | ORAL_TABLET | Freq: Four times a day (QID) | ORAL | Status: DC
  Administered 2017-01-04 – 2017-01-08 (×15): 650 mg via ORAL
  Filled 2017-01-04 (×15): qty 2

## 2017-01-04 MED ORDER — KETAMINE HCL 10 MG/ML IJ SOLN
INTRAMUSCULAR | Status: AC
  Filled 2017-01-04: qty 1

## 2017-01-04 MED ORDER — SODIUM CHLORIDE 0.9 % IR SOLN
Status: DC | PRN
  Administered 2017-01-04: 2000 mL

## 2017-01-04 MED ORDER — NEOMYCIN SULFATE 500 MG PO TABS
1000.0000 mg | ORAL_TABLET | ORAL | Status: DC
  Filled 2017-01-04: qty 2

## 2017-01-04 MED ORDER — HYDROMORPHONE 1 MG/ML IV SOLN
INTRAVENOUS | Status: DC
  Administered 2017-01-04: 1 mg via INTRAVENOUS
  Administered 2017-01-04: 1.5 mg via INTRAVENOUS
  Administered 2017-01-04: 2.7 mg via INTRAVENOUS
  Administered 2017-01-05: 0.6 mg via INTRAVENOUS
  Administered 2017-01-05: 0.9 mg via INTRAVENOUS
  Administered 2017-01-05: 1.5 mg via INTRAVENOUS
  Administered 2017-01-05: 1.8 mg via INTRAVENOUS
  Administered 2017-01-05: 3.3 mg via INTRAVENOUS
  Administered 2017-01-05: 0.9 mg via INTRAVENOUS
  Administered 2017-01-06: 3 mg via INTRAVENOUS
  Administered 2017-01-06: 1.2 mg via INTRAVENOUS
  Filled 2017-01-04: qty 25

## 2017-01-04 MED ORDER — BUPIVACAINE LIPOSOME 1.3 % IJ SUSP
20.0000 mL | Freq: Once | INTRAMUSCULAR | Status: AC
  Administered 2017-01-04: 20 mL
  Filled 2017-01-04: qty 20

## 2017-01-04 MED ORDER — ACETAMINOPHEN 500 MG PO TABS
1000.0000 mg | ORAL_TABLET | ORAL | Status: AC
  Administered 2017-01-04: 1000 mg via ORAL
  Filled 2017-01-04: qty 2

## 2017-01-04 MED ORDER — ROCURONIUM BROMIDE 10 MG/ML (PF) SYRINGE
PREFILLED_SYRINGE | INTRAVENOUS | Status: DC | PRN
  Administered 2017-01-04: 50 mg via INTRAVENOUS
  Administered 2017-01-04 (×3): 20 mg via INTRAVENOUS

## 2017-01-04 MED ORDER — LACTATED RINGERS IV SOLN
INTRAVENOUS | Status: DC
  Administered 2017-01-05: 1000 mL via INTRAVENOUS
  Administered 2017-01-05: 19:00:00 via INTRAVENOUS

## 2017-01-04 MED ORDER — NAPHAZOLINE-GLYCERIN 0.012-0.2 % OP SOLN
1.0000 [drp] | Freq: Four times a day (QID) | OPHTHALMIC | Status: DC | PRN
  Filled 2017-01-04 (×2): qty 15

## 2017-01-04 MED ORDER — SUGAMMADEX SODIUM 500 MG/5ML IV SOLN
INTRAVENOUS | Status: DC | PRN
  Administered 2017-01-04: 400 mg via INTRAVENOUS

## 2017-01-04 MED ORDER — OXYCODONE HCL 5 MG/5ML PO SOLN: 5.0000 mg | Freq: Once | ORAL | Status: DC | PRN

## 2017-01-04 MED ORDER — DEXAMETHASONE SODIUM PHOSPHATE 10 MG/ML IJ SOLN
INTRAMUSCULAR | Status: AC
  Filled 2017-01-04: qty 1

## 2017-01-04 MED ORDER — LIDOCAINE 2% (20 MG/ML) 5 ML SYRINGE
INTRAMUSCULAR | Status: DC | PRN
  Administered 2017-01-04: 100 mg via INTRAVENOUS

## 2017-01-04 MED ORDER — LACTATED RINGERS IV SOLN
INTRAVENOUS | Status: DC | PRN
  Administered 2017-01-04 (×3): via INTRAVENOUS

## 2017-01-04 SURGICAL SUPPLY — 81 items
APPLIER CLIP ROT 10 11.4 M/L (STAPLE)
BAG URO CATCHER STRL LF (MISCELLANEOUS) ×4 IMPLANT
BLADE CLIPPER SURG (BLADE) ×4 IMPLANT
CANISTER SUCT 3000ML PPV (MISCELLANEOUS) IMPLANT
CATH INTERMIT  6FR 70CM (CATHETERS) ×8 IMPLANT
CELLS DAT CNTRL 66122 CELL SVR (MISCELLANEOUS) IMPLANT
CHLORAPREP W/TINT 26ML (MISCELLANEOUS) ×4 IMPLANT
CLIP APPLIE ROT 10 11.4 M/L (STAPLE) IMPLANT
CLOTH BEACON ORANGE TIMEOUT ST (SAFETY) IMPLANT
COVER FOOTSWITCH UNIV (MISCELLANEOUS) IMPLANT
COVER MAYO STAND STRL (DRAPES) ×8 IMPLANT
COVER SURGICAL LIGHT HANDLE (MISCELLANEOUS) ×12 IMPLANT
DERMABOND ADVANCED (GAUZE/BANDAGES/DRESSINGS) ×1
DERMABOND ADVANCED .7 DNX12 (GAUZE/BANDAGES/DRESSINGS) ×3 IMPLANT
DRAPE HALF SHEET 40X57 (DRAPES) IMPLANT
DRAPE UTILITY XL STRL (DRAPES) ×4 IMPLANT
DRAPE WARM FLUID 44X44 (DRAPE) ×4 IMPLANT
DRSG OPSITE POSTOP 4X10 (GAUZE/BANDAGES/DRESSINGS) IMPLANT
DRSG OPSITE POSTOP 4X6 (GAUZE/BANDAGES/DRESSINGS) ×4 IMPLANT
DRSG OPSITE POSTOP 4X8 (GAUZE/BANDAGES/DRESSINGS) IMPLANT
ELECT BLADE 6.5 EXT (BLADE) IMPLANT
ELECT CAUTERY BLADE 6.4 (BLADE) IMPLANT
ELECT REM PT RETURN 9FT ADLT (ELECTROSURGICAL) ×4
ELECTRODE REM PT RTRN 9FT ADLT (ELECTROSURGICAL) ×3 IMPLANT
GEL ULTRASOUND 20GR AQUASONIC (MISCELLANEOUS) IMPLANT
GLOVE BIOGEL M STRL SZ7.5 (GLOVE) ×12 IMPLANT
GLOVE BIOGEL PI IND STRL 8 (GLOVE) ×6 IMPLANT
GLOVE BIOGEL PI INDICATOR 8 (GLOVE) ×2
GOWN STRL REUS W/ TWL LRG LVL3 (GOWN DISPOSABLE) ×18 IMPLANT
GOWN STRL REUS W/ TWL XL LVL3 (GOWN DISPOSABLE) ×6 IMPLANT
GOWN STRL REUS W/TWL LRG LVL3 (GOWN DISPOSABLE) ×14 IMPLANT
GOWN STRL REUS W/TWL XL LVL3 (GOWN DISPOSABLE) ×2
GUIDEWIRE STR DUAL SENSOR (WIRE) ×4 IMPLANT
KIT BASIN OR (CUSTOM PROCEDURE TRAY) ×4 IMPLANT
LEGGING LITHOTOMY PAIR STRL (DRAPES) ×4 IMPLANT
MANIFOLD NEPTUNE II (INSTRUMENTS) ×4 IMPLANT
NS IRRIG 1000ML POUR BTL (IV SOLUTION) ×8 IMPLANT
PACK COLON (CUSTOM PROCEDURE TRAY) ×4 IMPLANT
PACK CYSTO (CUSTOM PROCEDURE TRAY) ×4 IMPLANT
PAD ARMBOARD 7.5X6 YLW CONV (MISCELLANEOUS) ×8 IMPLANT
PENCIL BUTTON HOLSTER BLD 10FT (ELECTRODE) ×4 IMPLANT
RTRCTR WOUND ALEXIS 18CM MED (MISCELLANEOUS)
SCISSORS LAP 5X35 DISP (ENDOMECHANICALS) ×4 IMPLANT
SEALER TISSUE G2 CVD JAW 45CM (ENDOMECHANICALS) ×4 IMPLANT
SET IRRIG TUBING LAPAROSCOPIC (IRRIGATION / IRRIGATOR) ×4 IMPLANT
SHEARS HARMONIC ACE PLUS 36CM (ENDOMECHANICALS) IMPLANT
SLEEVE ADV FIXATION 5X100MM (TROCAR) ×4 IMPLANT
SLEEVE ENDOPATH XCEL 5M (ENDOMECHANICALS) ×4 IMPLANT
SPECIMEN JAR LARGE (MISCELLANEOUS) ×4 IMPLANT
STAPLER CIRC CVD 29MM 37CM (STAPLE) ×4 IMPLANT
STAPLER CUT CVD 40MM GREEN (STAPLE) ×4 IMPLANT
STAPLER VISISTAT 35W (STAPLE) ×4 IMPLANT
SUCTION POOLE TIP (SUCTIONS) ×4 IMPLANT
SURGILUBE 2OZ TUBE FLIPTOP (MISCELLANEOUS) IMPLANT
SUT MNCRL AB 4-0 PS2 18 (SUTURE) ×8 IMPLANT
SUT PDS AB 1 TP1 96 (SUTURE) ×8 IMPLANT
SUT PROLENE 2 0 CT2 30 (SUTURE) IMPLANT
SUT PROLENE 2 0 KS (SUTURE) ×4 IMPLANT
SUT SILK 2 0 SH CR/8 (SUTURE) ×4 IMPLANT
SUT SILK 2 0 TIES 10X30 (SUTURE) ×4 IMPLANT
SUT SILK 3 0 SH CR/8 (SUTURE) ×4 IMPLANT
SUT SILK 3 0 TIES 10X30 (SUTURE) ×4 IMPLANT
SUT VIC AB 2-0 SH 27 (SUTURE) ×2
SUT VIC AB 2-0 SH 27X BRD (SUTURE) ×6 IMPLANT
SYR BULB IRRIGATION 50ML (SYRINGE) ×4 IMPLANT
SYRINGE 20CC LL (MISCELLANEOUS) ×4 IMPLANT
SYS LAPSCP GELPORT 120MM (MISCELLANEOUS)
SYSTEM LAPSCP GELPORT 120MM (MISCELLANEOUS) IMPLANT
TOWEL OR 17X26 10 PK STRL BLUE (TOWEL DISPOSABLE) ×8 IMPLANT
TRAY FOLEY W/METER SILVER 16FR (SET/KITS/TRAYS/PACK) ×4 IMPLANT
TRAY LAPAROSCOPIC MC (CUSTOM PROCEDURE TRAY) IMPLANT
TRAY PROCTOSCOPIC FIBER OPTIC (SET/KITS/TRAYS/PACK) IMPLANT
TROCAR XCEL 12X100 BLDLESS (ENDOMECHANICALS) IMPLANT
TROCAR XCEL BLUNT TIP 100MML (ENDOMECHANICALS) ×4 IMPLANT
TROCAR XCEL NON-BLD 11X100MML (ENDOMECHANICALS) IMPLANT
TROCAR XCEL NON-BLD 5MMX100MML (ENDOMECHANICALS) ×4 IMPLANT
TUBE CONNECTING 12X1/4 (SUCTIONS) ×8 IMPLANT
TUBING CONNECTING 10 (TUBING) ×4 IMPLANT
TUBING INSUF HEATED (TUBING) ×4 IMPLANT
TUBING INSUFFLATION (TUBING) IMPLANT
YANKAUER SUCT BULB TIP NO VENT (SUCTIONS) ×8 IMPLANT

## 2017-01-04 NOTE — Op Note (Signed)
PATIENT: Matthew Reid  56 y.o. male  Patient Care Team: Farris Has, MD as PCP - General (Family Medicine)  PREOP DIAGNOSIS: diverticulitis  POSTOP DIAGNOSIS: diverticulitis  PROCEDURE:  1. Laparoscopic sigmoid colectomy 2. Mobilization of splenic flexure 3. Rigid proctoscopy  SURGEON: Stephanie Coup. Cliffton Asters, MD  ASSISTANT: Claud Kelp, MD  ANESTHESIA: General endotracheal  EBL: 200cc Total I/O In: 2500 [I.V.:2500] Out: 280 [Urine:80; Blood:200]  DRAINS: None  SPECIMEN: Sigmoid colon; stitch and staple line mark distal margin of resection  COUNTS: Sponge, needle and instrument counts were reported correct x2  FINDINGS: Cysto/stents performed by urology. Findings consistent with prior perforated diverticulitis. Prior abscess cavity collapsed and no pus encountered. The prior abscess cavity was located posteriorly along the left retroperitoneum. The left ureter was identified prior to dividing any structures and care was taken to protect it from any injury. Splenic flexure mobilized up to the lesser sac, further mobilization was not necessary. Entire sigmoid colon removed. The anastomosis was located at the pelvic brim where the tenia coli splayed onto the rectum at the 3rd valve of [place] approximately 15cm proximal to the anal verge. The colon was divided proximally at the level of the descending/sigmoid junction. There were no diverticula at the anastomosis. Anastomosis was tension free, pink, without twisting.  STATEMENT OF MEDICAL NECESSITY: Matthew Reid is a very pleasant 56 year-old gentleman with hx of HTN, DM (controlled on metformin), HLD presented with LLQ pain x12d in early October at the [facility] emergency room.  Numerous attacks of diverticulitis in the past - all managed with either inpt or outpt abx; never had a drain nor surgery. 1st episode was approx 87yrs ago in [place]. He was admitted to [facility] 11/24/16 with diverticulitis of the sigmoid with large pericolic  abscess - 8.6cm in largest diameter.Underwent percutaneous drainage and IV abx - drain output was purulent, no stool. Subsequently improved and was discharged home. He completed his course of abx and saw IR back for a drain study. On 12/08/16 he underwent a drain study which demonstrated no fistulous connection to the sigmoid colon. The abscess cavity had resolved. The drainage catheter was successfully removed. Since that time he had been doing well. He reports normal bowel function. He denies fever/chills/abdominal pain. He was tolerating a diet. His most recent colonoscopy was 4 years ago and normal; one prior was 9 years ago and a benign polyp was removed.  The anatomy, physiology and pathophysiology of the digestive tract was discussed with the patient using pictures and diagrams. The natural history of the disease without surgery was discussed.  I worked to give an overview of the treatment approach. I feel the risks of no intervention will lead to serious problems that outweigh the operative risks; therefore, I recommended a sigmoid colectomy to address the pathology.  Laparoscopic & open techniques were discussed in great detail. The possible need for stoma was also discussed and he was marked preoperatively in the right mid abdomen. The material risks (including but not limited to pain, bleeding, infection, scarring, abscess, leak, need for additional procedures, possible ostomy, injury to ureters, hernia, heart attack, stroke and death, and other risks), benefits and alternatives were discussed.  I noted a good likelihood this will help address the problem.   Goals of post-operative recovery were discussed as well. The patient expressed understanding and elected to proceed with surgery.  NARRATIVE:  Informed consent was confirmed. The patient was taken to the operating room, placed supine on the operating table and SCD's were  applied. General endotracheal anesthesia was induced. The patient  was then positioned in the lithotomy position with Allen stirrups.  Urology then scrubbed; the patient was prepped and draped for placement of ureteral stents and cystoscopy.  Antibiotics have been administered.  Cystoscopy/stents was then performed -please refer to Dr. Shannan HarperBell's dictation for details regarding this portion of the procedure.  The patient's abdomen was then prepped and draped in the standard sterile fashion. Surgical timeout confirmed our plan.  Abdominal entry was gained using the [facility] technique.  An infraumbilical incision was made.  This was carried down to the umbilical stalk which was dissected and grasped with a Kocher clamp.  The umbilical stalk was retracted outwardly in the midline infraumbilical fascia incised.  The peritoneum was then bluntly entered.  A stay suture of 0 Vicryl figure-of-eight was then placed.  Sun trocar was inserted into the peritoneal cavity and insufflation commenced to 15mmHg of CO2.  Camera inspection revealed no injury.  Three additional 5mm ports were carefully placed under direct laparoscopic visualization.  The patient was positioned in Trendelenburg. The omentum and small bowel was reflected cephalad.  A lateral to medial approach was utilized.  The descending colon was mobilized up to the splenic flexure along the Ossiel Marchio line of Toldt. As the spleen was approached, the patient was repositioned in reverse Trendelenburg.  The splenic flexure was then taken down up to the level of the greater omentum.  The descending colon was able to be reflected medially to the midline.  Attention was then turned to sigmoid colon mobilization. The patient was repositioned in Trendelenburg.  The sigmoid colon was mobilized off the intersigmoid fossa and care was taken to protect the left ureter from injury.  The ureter and stent was identified and left in place along with the gonadal vessels - posterior to the mesentery.  The sigmoid mesentery was mobilized. The sigmoid colon  was then elevated. The peritoneum overlying the distal branches of the IMA was scored and the peritoneum opened down to the proximal rectum. The distal IMA branches were isolated and location of the left ureter confirmed to be down and posterior to where the plane of dissection was located. The mesentery including the distal IMA branches was divided with an EnSeal energy device. The intervening mesentery from the distal descending colon to the proximal rectum was divided with an Enseal device.  Adequate length of colon was confirmed in the distal descending colon easily reached the proximal rectum without any tension.  Hemostasis was then again verified.  Attention was then turned to extracorporeal anastomosis.  A Pfannenstiel incision was made approximately 2 finger breadths above the pubic symphysis and carried down to the anterior rectus fascia.  The pyramidalis muscle was divided.  The anterior rectus fascia was then opened transversely.  Kocher clamps were placed on the fascia and the fibroconnective attachments to the anterior rectus fascia were taken down with electrocautery.  Hemostasis was verified.  The midline peritoneum was then incised and the peritoneal cavity entered.  Care was taken to protect the bladder from injury during opening of the peritoneum.  An Alexis wound protector was then placed.  The sigmoid colon was grasped and eviscerated from the peritoneal cavity.  The proximal and distal sites of transection were identified.  The proximal site of transection being at the level of the distal descending colon.  The distal point of transection being at the proximal rectum.  This corresponded to the location where the tenia splayed, the epiploica appendages terminated  and overlying the sacral promontory.  The distal site of transection was done using a contoured stapler, green load.  A pursestring device was then applied to the distal descending colon and a 2-0 Prolene passed through the device  using a Keith needle.  Just distal to this the colon was divided and the sigmoid colon was passed off the specimen.  The staple line and additionally a silk suture were used to mark the distal aspect of sigmoid colon.  EEA sizers were then used in the distal descending colon would not accommodate a 33 mm EEA sizer but did accommodate a 29 mm sizer.  For this reason, a 29 mm EEA was utilized.  The anvil was placed and the pursestring tied.  Both ends were then prepared for anastomosis.  A 29 mm EEA sizer was carefully passed up the rectum under direct visualization.  The EEA stapler was then passed and the spike deployed just anterior to the staple line.  The components were mated and orientation of the proximal limb of colon confirmed there was no twisting of the colon nor mesentery. The two ends reached one another without any tension. Care was taken to ensure no other structures were included in the staple line. The anastomosis was then created.  We then turned our attention to performing a leak test.  The colon proximal to the circular anastomosis was occluded and a rigid proctoscopy was performed. The pelvis was filled with sterile saline. Air insufflation via proctoscopy scope commenced and no bubbles or air leak was identified.  Both ends of the bowel at the level of the anastomosis were pink.  The irrigation was evacuated.  Hemostasis was verified.  The Alexis wound protector and 5 mm ports were all removed.  Attention was then turned to closing the abdomen.  The abdomen was redraped with sterile drapes.  All equipment was exchanged for clean equipment.  Gloves and gowns of all scrubbed participants were changed.  The wounds were then irrigated with sterile saline.  The peritoneum was closed with a running 2-0 Vicryl suture.  Care was taken to again avoid any injury to the bladder.  The rectus fascia was then approximated with running #1 PDS suture.  The abdomen was then reinsufflated using the [facility] cannula  that was still in the umbilicus.  Inspection revealed no evidence of bleeding and an intact Pfannenstiel closure.  The pneumoperitoneum was then evacuated and the [facility] cannula removed.  The figure-of-eight 0 Vicryl suture was then tied.  The skin of all incision sites was closed with 4-0 Monocryl subcuticular sutures.  Dermabond was applied to all incisions and a sterile dressing placed over the Pfannenstiel incision.  The ureteral stents at the end of the case were then removed and the Foley catheter left in place.  An MD assistant was necessary for tissue manipulation, retraction and positioning due to the complexity of the case, obesity of the patient and hospital policies  DISPOSITION: PACU then ward; stable condition

## 2017-01-04 NOTE — Anesthesia Procedure Notes (Signed)
Procedure Name: Intubation Date/Time: 01/04/2017 7:33 AM Performed by: Lind Covert, CRNA Pre-anesthesia Checklist: Patient identified, Emergency Drugs available, Suction available, Patient being monitored and Timeout performed Patient Re-evaluated:Patient Re-evaluated prior to induction Oxygen Delivery Method: Circle system utilized Preoxygenation: Pre-oxygenation with 100% oxygen Induction Type: IV induction Ventilation: Mask ventilation without difficulty Laryngoscope Size: Mac and 4 Grade View: Grade I Tube type: Oral Tube size: 7.5 mm Number of attempts: 1 Airway Equipment and Method: Stylet Placement Confirmation: ETT inserted through vocal cords under direct vision,  positive ETCO2 and breath sounds checked- equal and bilateral Secured at: 22 cm Tube secured with: Tape Dental Injury: Teeth and Oropharynx as per pre-operative assessment

## 2017-01-04 NOTE — Anesthesia Postprocedure Evaluation (Signed)
Anesthesia Post Note  Patient: Matthew Reid  Procedure(s) Performed: LAPAROSCOPIC SIGMOIDECTOMY WITH rigid proctoscopy  and takedown of splenic flexure (N/A Abdomen) CYSTOSCOPY WITH BILATERAL STENT PLACEMENT (Bilateral )     Patient location during evaluation: PACU Anesthesia Type: General Level of consciousness: awake, oriented and sedated Pain management: pain level controlled Vital Signs Assessment: post-procedure vital signs reviewed and stable Respiratory status: spontaneous breathing, nonlabored ventilation, respiratory function stable and patient connected to nasal cannula oxygen Cardiovascular status: blood pressure returned to baseline and stable Postop Assessment: no apparent nausea or vomiting Anesthetic complications: no    Last Vitals:  Vitals:   01/04/17 1230 01/04/17 1252  BP: (!) 141/79 (!) 134/94  Pulse: 80 91  Resp: 15 15  Temp:  36.9 C  SpO2: 95% 98%    Last Pain:  Vitals:   01/04/17 1200  TempSrc:   PainSc: Asleep                 Liann Spaeth,JAMES TERRILL

## 2017-01-04 NOTE — Consult Note (Signed)
H&P Physician requesting consult: Marin Olphristopher White, MD  Chief Complaint: Diverticulitis  History of Present Illness: 56 year old male with history of recurrent diverticulitis presents for sigmoid colectomy.  Dr. Cliffton AstersWhite request intraoperative stents be placed.  Past Medical History:  Diagnosis Date  . Diverticulitis   . Dysrhythmia    "1990s; palpations; tested; turned out to be stress" (11/26/2016)  . High cholesterol   . Hypertension   . Type II diabetes mellitus (HCC)    Past Surgical History:  Procedure Laterality Date  . ABDOMINAL HERNIA REPAIR  ~ 1998  . CT GUIDE ABSCESS DRAINAGE  ([facility] HX)  11/24/2016   CT guided percutaneous catheter drainage of diverticular abscess/notes 11/24/2016  . HEEL SPUR SURGERY  2012   Bilateral  . HERNIA REPAIR    . IR RADIOLOGIST EVAL & MGMT  12/08/2016  . NASAL SEPTUM SURGERY  1980s  . TENDON RECONSTRUCTION  1987  . TOE AMPUTATION  1984   L great toe  . TONSILLECTOMY    . VASECTOMY  2013    Home Medications:  Medications Prior to Admission  Medication Sig Dispense Refill Last Dose  . acetaminophen (TYLENOL) 500 MG tablet Take 500 mg every 4 (four) hours as needed by mouth for mild pain.    Past Month at Unknown time  . aspirin EC 81 MG tablet Take 81 mg every evening by mouth.    Past Month at Unknown time  . atorvastatin (LIPITOR) 20 MG tablet Take 20 mg by mouth daily.  0 01/03/2017 at Unknown time  . metFORMIN (GLUCOPHAGE) 1000 MG tablet Take 1,000 mg by mouth 2 (two) times daily.  0 01/03/2017 at Unknown time  . metoprolol succinate (TOPROL-XL) 50 MG 24 hr tablet Take 50 mg every evening by mouth.   0 01/03/2017 at 2200  . albuterol (PROVENTIL HFA;VENTOLIN HFA) 108 (90 Base) MCG/ACT inhaler Inhale 2 puffs every 6 (six) hours as needed into the lungs for wheezing or shortness of breath.   More than a month at Unknown time  . cefpodoxime (VANTIN) 200 MG tablet Take 2 tablets (400 mg total) by mouth 2 (two) times daily. (Patient not  taking: Reported on 12/23/2016) 56 tablet 0 Not Taking at Unknown time  . metroNIDAZOLE (FLAGYL) 500 MG tablet Take 1 tablet (500 mg total) by mouth 3 (three) times daily. (Patient not taking: Reported on 12/23/2016) 42 tablet 0 Not Taking at Unknown time  . sodium chloride flush (NS) 0.9 % SOLN Flush the abdominal drain with 10 ml every 8 hours. (Patient not taking: Reported on 12/23/2016) 65 Syringe 0 Not Taking at Unknown time   Allergies:  Allergies  Allergen Reactions  . Glipizide Other (See Comments)    Made the patient feel "lousy"    Family History  Problem Relation Age of Onset  . Hypertension Father    Social History:  reports that he has quit smoking. His smoking use included cigarettes. He has quit using smokeless tobacco. He reports that he drinks alcohol. He reports that he uses drugs. Drug: Marijuana.  ROS: A complete review of systems was performed.  All systems are negative except for pertinent findings as noted. ROS   Physical Exam:  Vital signs in last 24 hours: Temp:  [98.4 F (36.9 C)] 98.4 F (36.9 C) (11/26 0530) Pulse Rate:  [76] 76 (11/26 0530) Resp:  [16] 16 (11/26 0530) BP: (115)/(77) 115/77 (11/26 0530) SpO2:  [98 %] 98 % (11/26 0530) Weight:  [106.1 kg (234 lb)] 106.1 kg (234 lb) (  11/26 0603) General:  Alert and oriented, No acute distress HEENT: Normocephalic, atraumatic Neck: No JVD or lymphadenopathy Cardiovascular: Regular rate and rhythm Lungs: Regular rate and effort Abdomen: Soft, nontender, nondistended, no abdominal masses Back: No CVA tenderness Extremities: No edema Neurologic: Grossly intact  Laboratory Data:  Results for orders placed or performed during the hospital encounter of 01/04/17 (from the past 24 hour(s))  Glucose, capillary     Status: Abnormal   Collection Time: 01/04/17  5:28 AM  Result Value Ref Range   Glucose-Capillary 180 (H) 65 - 99 mg/dL   Comment 1 Notify RN    Comment 2 Document in Chart    No results  found for this or any previous visit (from the past 240 hour(s)). Creatinine: Recent Labs    12/30/16 1440  CREATININE 0.96    Impression/Assessment:  Diverticulitis  Plan:  56 year old male with history of recurrent diverticulitis presents for sigmoid colectomy.  Dr. Cliffton AstersWhite request intraoperative stents be placed.  Proceed with bilateral ureteral stent placement.   Ray ChurchEugene D Kiyah Demartini, III 01/04/2017, 7:29 AM

## 2017-01-04 NOTE — Anesthesia Preprocedure Evaluation (Addendum)
Anesthesia Evaluation  Patient identified by MRN, date of birth, ID band Patient awake    Reviewed: Allergy & Precautions, NPO status , Patient's Chart, lab work & pertinent test results  Airway Mallampati: II  TM Distance: <3 FB Neck ROM: Full    Dental  (+) Teeth Intact   Pulmonary former smoker,    breath sounds clear to auscultation       Cardiovascular hypertension,  Rhythm:Regular Rate:Normal     Neuro/Psych    GI/Hepatic   Endo/Other  diabetesMorbid obesity  Renal/GU      Musculoskeletal   Abdominal (+) + obese,   Peds  Hematology   Anesthesia Other Findings   Reproductive/Obstetrics                            Anesthesia Physical Anesthesia Plan  ASA: III  Anesthesia Plan: General   Post-op Pain Management:    Induction: Intravenous  PONV Risk Score and Plan: 4 or greater and Treatment may vary due to age or medical condition, Dexamethasone and Scopolamine patch - Pre-op  Airway Management Planned:   Additional Equipment:   Intra-op Plan:   Post-operative Plan: Extubation in OR  Informed Consent: I have reviewed the patients History and Physical, chart, labs and discussed the procedure including the risks, benefits and alternatives for the proposed anesthesia with the patient or authorized representative who has indicated his/her understanding and acceptance.   Dental advisory given  Plan Discussed with: CRNA  Anesthesia Plan Comments:         Anesthesia Quick Evaluation

## 2017-01-04 NOTE — Op Note (Signed)
Operative Note  Preoperative diagnosis:  1.  Diverticulitis  Postoperative diagnosis: 1.  Diverticulitis  Procedure(s): 1.  Cystoscopy with bilateral retrograde pyelogram and bilateral ureteral stent placement  Surgeon: Modena SlaterEugene Ouida Abeyta, MD  Assistants: None  Anesthesia: General  Complications: None immediate  EBL: Minimal  Specimens: 1.  None  Drains/Catheters: 1.  Bilateral open-ended ureteral catheters  Intraoperative findings: Normal urethra and bladder, bilateral retrograde pyelograms revealed no obvious filling defects.  The ureter and kidney were well opacified.  Indication: 56 year old male with a history of recurrent diverticulitis presents for sigmoid colectomy.  Intraoperative ureteral stent placement was requested.  Description of procedure:  Patient was identified and consent was obtained.  He was taken operating room and placed in supine position.  He was placed under general anesthesia.  He was converted to dorsal lithotomy.  He was prepped and draped in standard sterile fashion.  Timeout was performed.  Perioperative antibiotics had been given.  A 23 French rigid cystoscope was advanced into the urethra and into the bladder.  Complete cystoscopy was performed.  The right ureter was cannulated with a open-ended ureteral catheter and a retrograde pyelogram was performed.  I then advanced a sensor wire up to the right kidney and advanced the open-ended ureteral catheter over that.  I then performed the same procedure on the left.  The findings are noted above.  The scope was withdrawn keeping the stents in place.  A Foley catheter was placed.  The stents were secured to the Foley catheter.  This concluded my portion of the operation.  Case was handed over to Dr. Cliffton AstersWhite.  Plan: Stent management per Dr. Cliffton AstersWhite.  I will be available as needed.

## 2017-01-04 NOTE — Transfer of Care (Signed)
Immediate Anesthesia Transfer of Care Note  Patient: Matthew Reid  Procedure(s) Performed: LAPAROSCOPIC SIGMOIDECTOMY WITH rigid proctoscopy  and takedown of splenic flexure (N/A Abdomen) CYSTOSCOPY WITH BILATERAL STENT PLACEMENT (Bilateral )  Patient Location: PACU  Anesthesia Type:General  Level of Consciousness: sedated  Airway & Oxygen Therapy: Patient Spontanous Breathing and Patient connected to face mask oxygen  Post-op Assessment: Report given to RN and Post -op Vital signs reviewed and stable  Post vital signs: Reviewed and stable  Last Vitals:  Vitals:   01/04/17 0530  BP: 115/77  Pulse: 76  Resp: 16  Temp: 36.9 C  SpO2: 98%    Last Pain:  Vitals:   01/04/17 0530  TempSrc: Oral      Patients Stated Pain Goal: 4 (01/04/17 0603)  Complications: No apparent anesthesia complications

## 2017-01-04 NOTE — H&P (Signed)
Patient words: CC: Here for f/u following [facility] for perfor[facility] diverticulitis with large abscess 11/24/16  HPI: 26M with hx of HTN, DM (controlled on metformin), HLD presented with LLQ pain x12d. Numerous attacks of diverticulitis in the past - all managed with either inpt or outpt abx; never had a drain nor surgery. [place] 7[place]. [facility] with diverticulitis of the sigmoid with large pericolic abscess - 8.6cm in largest diameter.Underwent percutaneous drainage and IV abx - drain output was purulent. Subsequently improved and was discharged home. He completed his course of abx and saw IR back for a drain study.  On 10/30/1 he underwent a drain study which demonstrated no fistulous connection to the sigmoid colon.  The abscess cavity had resolved.  The drainage catheter was successfully removed.  Since that time he has been doing well.  He reports normal bowel function.  He denies fever/chills/abdominal pain.  He is tolerating a diet.    No prior cardiac history. He reports he works around the house and outside without issues. He can comfortably climb 2 flights of stairs without any shortness of breath.  He denies any chest pain with activity or rest.  PMH: DM well controlled on metformin; HLD  PSH: Lap umbilical hernia repair with mesh 1997.  Last cscope 7033yrs ago was reportedly normal per pt. Has a hx of colon polyps 4372yrs ago  FHx: Denies FHx of malignancy Social: Denies use of tobacco/EtOH/drugs FHx: Denies FHx of malignancy ROS: A comprehensive 10 system review of systems was completed and pertinent findings noted above.  The patient is a 56 year old male.   Past Surgical History Colon Polyp Removal - Colonoscopy   Tonsillectomy   Vasectomy    Diagnostic Studies History Colonoscopy   1-5 years ago  Medication History Atorvastatin Calcium  (20MG  Tablet, Oral) Active. MetFORMIN HCl  (1000MG  Tablet, Oral) Active. Metoprolol  Succinate ER  (50MG  Tablet ER 24HR, Oral) Active. Aspirin  (81MG  Tablet, Oral) Active. Vitamin D (Cholecalciferol)  (1000UNIT Capsule, Oral) Active. Medications Reconciled   Social History Alcohol use   Occasional alcohol use. Caffeine use   Coffee. Illicit drug use   Prefer to discuss with provider.  Family History Diabetes Mellitus   Brother.  Other Problems Diabetes Mellitus   Diverticulosis   Hypercholesterolemia    Note: NKDA  Review of Systems General Not Present- Appetite Loss, Chills, Fatigue, Fever, Night Sweats, Weight Gain and Weight Loss. Skin Not Present- Change in Wart/Mole, Dryness, Hives, Jaundice, New Lesions, Non-Healing Wounds, Rash and Ulcer. HEENT Present- Wears glasses/contact lenses. Not Present- Earache, Hearing Loss, Hoarseness, Nose Bleed, Oral Ulcers, Ringing in the Ears, Seasonal Allergies, Sinus Pain, Sore Throat, Visual Disturbances and Yellow Eyes. Respiratory Not Present- Bloody sputum, Chronic Cough, Difficulty Breathing, Snoring and Wheezing. Breast Not Present- Breast Mass, Breast Pain, Nipple Discharge and Skin Changes. Cardiovascular Present- Palpitations. Not Present- Chest Pain, Difficulty Breathing Lying Down, Leg Cramps, Rapid Heart Rate, Shortness of Breath and Swelling of Extremities. Gastrointestinal Present- Constipation. Not Present- Abdominal Pain, Bloating, Bloody Stool, Change in Bowel Habits, Chronic diarrhea, Difficulty Swallowing, Excessive gas, Gets full quickly at meals, Hemorrhoids, Indigestion, Nausea, Rectal Pain and Vomiting. Male Genitourinary Not Present- Blood in Urine, Change in Urinary Stream, Frequency, Impotence, Nocturia, Painful Urination, Urgency and Urine Leakage. Musculoskeletal Present- Joint Pain. Not Present- Joint Swelling. Neurological Not Present- Decreased Memory and Difficulty Speaking. Psychiatric Not Present- Anxiety and Depression. Hematology Not Present- Abnormal Bleeding and  Blood  Thinners.  Vitals 12/21/2016 10:41 AM Weight: 233.8 lb   Height: 67 in  Body Surface Area: 2.16 m   Body Mass Index: 36.62 kg/m   Temp.: 97.5 F    Pulse: 84 (Regular)    BP: 112/72 (Sitting, Left Arm, Standard)  Physical Exam The physical exam findings are as follows: Note: Constitutional: No acute distress, conversant, no deformities Eyes: Moist conjunctiva; no lid lag; anicteric; pupils equal round and reactive to light Neck: Trachea midline; no palpable thyromegaly Lungs: Normal respiratory effort; no tactile fremitus CV: Regular rate and rhythm; no palpable thrill; no pitting edema GI: Abdomen soft, nontender, nondistended; no palpable hepatosplenomegaly. Drain site well healed MSK: Normal gait; no clubbing/cyanosis Psych: Appropriate affect; alert and oriented 3 Lymphatic: No palpable cervical or axillary lymphadenopathy   Assessment & Plan SIGMOID DIVERTICULITIS (Z61.09(K57.32) Impression: [name] is a very pleasant 56yoM with recent hx of multiple attacks of sigmoid diverticulitis, most recently with large abscess requiring percutaneous drainage - here surgery -The anatomy and physiology of the GI tract was discussed at length. The pathophysiology of diverticulitis in the natural course of this disease was discussed using pictures and diagrams. The planned procedure, material risks (included but not limited to pain, bleeding, infection, scarring, recurrence, leak, damage to surrounding structures including viscous/ureter/bladder, need for additional procedures, permanent stoma, hernia, heart attack, stroke, death) benefits and alternatives were discussed. Additionally we discussed the preoperative and postoperative process as well as recovery from surgery. His questions were answered to his satisfaction. And he elected to proceed with surgery. -OR today for laparoscopic versus open sigmoid colectomy with cystoscopy/stents by urology, intraoperative flexible sigmoidoscopy, possible  stoma, all other indicated procedures

## 2017-01-04 NOTE — Brief Op Note (Signed)
01/04/2017  11:14 AM  PATIENT:  Matthew Reid  56 y.o. male  PRE-OPERATIVE DIAGNOSIS:  diverticulitis  POST-OPERATIVE DIAGNOSIS:  diverticulitis  PROCEDURE:  Procedure(s) with comments: LAPAROSCOPIC SIGMOIDECTOMY WITH rigid proctoscopy  and takedown of splenic flexure (N/A) - ERAS PATHWAY CYSTOSCOPY WITH BILATERAL STENT PLACEMENT (Bilateral)  SURGEON:  Surgeon(s) and Role: Panel 1:    * Shomari Matusik, Stephanie Couphristopher M, MD - Primary Panel 2:    * Crista ElliotBell, Eugene D III, MD - Primary  ASSIST: Claud KelpHaywood Ingram, MD  ANESTHESIA:   General endotracheal  EBL:  200 mL   BLOOD ADMINISTERED:none  DRAINS: none    SPECIMEN: Sigmoid colon to pathology  DISPOSITION OF SPECIMEN:  PATHOLOGY  COUNTS:  YES  TOURNIQUET:  * No tourniquets in log *  DICTATION: .Dragon Dictation  PLAN OF CARE: Admit to inpatient   PATIENT DISPOSITION:  PACU - hemodynamically stable.

## 2017-01-05 LAB — CBC
HCT: 42.4 % (ref 39.0–52.0)
Hemoglobin: 13.6 g/dL (ref 13.0–17.0)
MCH: 29.5 pg (ref 26.0–34.0)
MCHC: 32.1 g/dL (ref 30.0–36.0)
MCV: 92 fL (ref 78.0–100.0)
PLATELETS: 305 10*3/uL (ref 150–400)
RBC: 4.61 MIL/uL (ref 4.22–5.81)
RDW: 14.4 % (ref 11.5–15.5)
WBC: 12 10*3/uL — ABNORMAL HIGH (ref 4.0–10.5)

## 2017-01-05 LAB — GLUCOSE, CAPILLARY
GLUCOSE-CAPILLARY: 132 mg/dL — AB (ref 65–99)
GLUCOSE-CAPILLARY: 157 mg/dL — AB (ref 65–99)
Glucose-Capillary: 123 mg/dL — ABNORMAL HIGH (ref 65–99)
Glucose-Capillary: 115 mg/dL — ABNORMAL HIGH (ref 65–99)

## 2017-01-05 LAB — BASIC METABOLIC PANEL
Anion gap: 8 (ref 5–15)
BUN: 12 mg/dL (ref 6–20)
CALCIUM: 8.8 mg/dL — AB (ref 8.9–10.3)
CO2: 28 mmol/L (ref 22–32)
CREATININE: 1.23 mg/dL (ref 0.61–1.24)
Chloride: 99 mmol/L — ABNORMAL LOW (ref 101–111)
GFR calc non Af Amer: >60 mL/min (ref >60)
Glucose, Bld: 152 mg/dL — ABNORMAL HIGH (ref 65–99)
Potassium: 4.6 mmol/L (ref 3.5–5.1)
SODIUM: 135 mmol/L (ref 135–145)

## 2017-01-05 MED ORDER — METFORMIN HCL 500 MG PO TABS
500.0000 mg | ORAL_TABLET | Freq: Two times a day (BID) | ORAL | Status: DC
  Administered 2017-01-05 (×2): 500 mg via ORAL
  Filled 2017-01-05 (×3): qty 1

## 2017-01-05 MED ORDER — OXYCODONE HCL 5 MG PO TABS
5.0000 mg | ORAL_TABLET | Freq: Four times a day (QID) | ORAL | Status: DC | PRN
  Administered 2017-01-06 – 2017-01-08 (×3): 5 mg via ORAL
  Filled 2017-01-05 (×4): qty 1

## 2017-01-05 MED ORDER — PANTOPRAZOLE SODIUM 40 MG PO TBEC
40.0000 mg | DELAYED_RELEASE_TABLET | Freq: Every day | ORAL | Status: DC
  Administered 2017-01-05 – 2017-01-08 (×4): 40 mg via ORAL
  Filled 2017-01-05 (×5): qty 1

## 2017-01-05 NOTE — Progress Notes (Signed)
PT Cancellation Note / Screen  Patient Details Name: Matthew ShileyDavid Skelly MRN: [id number] DOB: [birth year]   Cancelled Treatment:    Reason Eval/Treat Not Completed: PT screened, no needs identified, will sign off Pt reports no difficulty with mobility and has been ambulating multiple times around unit.  He reports no need for assistive device.  Pt declines PT needs at this time.   Mckaila Duffus,KATHrine E 01/05/2017, 10:45 AM Zenovia JarredKati Abhiraj Dozal, PT, DPT 01/05/2017 Pager: [phone]

## 2017-01-05 NOTE — Progress Notes (Signed)
Subjective No acute events. Doing well. No flatus/BM. Tolerating liquids without nausea/vomiting. Ambulating - 7 laps around unit since OR. Using IS 10x/hr. Pain controlled. Some mild heartburn controlled with maalox.  Objective: Vital signs in last 24 hours: Temp:  [97.7 F (36.5 C)-98.9 F (37.2 C)] 97.9 F (36.6 C) (11/27 0524) Pulse Rate:  [62-91] 62 (11/27 0524) Resp:  [11-26] 16 (11/27 0524) BP: (105-144)/(56-94) 105/56 (11/27 0524) SpO2:  [93 %-100 %] 96 % (11/27 0524) Last BM Date: 01/04/17  Intake/Output from previous day: 11/26 0701 - 11/27 0700 In: 3181 [P.O.:480; I.V.:2701] Out: 2580 [Urine:2380; Blood:200] Intake/Output this shift: No intake/output data recorded.  Gen: NAD, comfortable CV: RRR Pulm: Normal work of breathing Abd: Soft, NT, mildly distended; no r/g. Incisions c/d/i without erythema. Ext: SCDs in place  Lab Results: CBC  Recent Labs    01/04/17 1325 01/05/17 0526  WBC 15.4* 12.0*  HGB 14.9 13.6  HCT 45.8 42.4  PLT 310 305   BMET Recent Labs    01/04/17 1325 01/05/17 0526  NA  --  135  K  --  4.6  CL  --  99*  CO2  --  28  GLUCOSE  --  152*  BUN  --  12  CREATININE 1.28* 1.23  CALCIUM  --  8.8*   PT/INR No results for input(s): LABPROT, INR in the last 72 hours. ABG No results for input(s): PHART, HCO3 in the last 72 hours.  Invalid input(s): PCO2, PO2  Studies/Results:  Anti-infectives: Anti-infectives (From admission, onward)   Start     Dose/Rate Route Frequency Ordered Stop   01/04/17 0636  cefoTEtan in Dextrose 5% (CEFOTAN) 2-2.08 GM-%(50ML) IVPB    Comments:  Nadene RubinsCochran, Ron   : cabinet override      01/04/17 0636 01/04/17 0738   01/04/17 0547  cefoTEtan in Dextrose 5% (CEFOTAN) IVPB 2 g     2 g Intravenous On call to O.R. 01/04/17 0547 01/04/17 0738   01/04/17 0547  neomycin (MYCIFRADIN) tablet 1,000 mg  Status:  Discontinued     1,000 mg Oral 3 times per day 01/04/17 0547 01/04/17 1256   01/04/17 0547   metroNIDAZOLE (FLAGYL) tablet 1,000 mg  Status:  Discontinued     1,000 mg Oral 3 times per day 01/04/17 0547 01/04/17 1256       Assessment/Plan: Patient Active Problem List   Diagnosis Date Noted  . Intestinal diverticular abscess 11/24/2016  . Diverticulitis 11/24/2016  . Essential hypertension 11/24/2016  . Leukocytosis 11/24/2016  . HLD (hyperlipidemia) 11/24/2016  . Type 2 diabetes mellitus with complication, without long-term current use of insulin (HCC) 11/24/2016   s/p Procedure(s): LAPAROSCOPIC SIGMOIDECTOMY WITH rigid proctoscopy  and takedown of splenic flexure CYSTOSCOPY WITH BILATERAL URETERAL CATHETERS  PLACEMENT  POD#1  -Advance to full liquids -PO PPI for heartburn -Continue PCA until tolerating regular diet -Continue foley today - anticipate removal tomorrow -Ambulate 5x/day; IS 10x/hr while awake -Ok to bathe and get soap/water over incisions  -PPx: SQH, SCDs, PPI -Dispo: Awaiting return of bowel function, pain control on oral analgesics, toleration of regular diet, voiding  LOS: 1 day   Stephanie Couphristopher M. Cliffton AstersWhite, M.D. General and Colorectal Surgery [facility]

## 2017-01-05 NOTE — Progress Notes (Signed)
OT Cancellation Note  Patient Details Name: Matthew Reid MRN: [id number] DOB: 02-18-1960   Cancelled Treatment:    Reason Eval/Treat Not Completed: OT screened, no needs identified, will sign off  Matthew Reid, Metro KungLorraine D 01/05/2017, 11:18 AM

## 2017-01-06 LAB — BASIC METABOLIC PANEL
ANION GAP: 4 — AB (ref 5–15)
BUN: 8 mg/dL (ref 6–20)
CALCIUM: 8.6 mg/dL — AB (ref 8.9–10.3)
CO2: 32 mmol/L (ref 22–32)
CREATININE: 0.87 mg/dL (ref 0.61–1.24)
Chloride: 102 mmol/L (ref 101–111)
Glucose, Bld: 111 mg/dL — ABNORMAL HIGH (ref 65–99)
Potassium: 4 mmol/L (ref 3.5–5.1)
Sodium: 138 mmol/L (ref 135–145)

## 2017-01-06 LAB — GLUCOSE, CAPILLARY
GLUCOSE-CAPILLARY: 111 mg/dL — AB (ref 65–99)
GLUCOSE-CAPILLARY: 107 mg/dL — AB (ref 65–99)
Glucose-Capillary: 133 mg/dL — ABNORMAL HIGH (ref 65–99)
Glucose-Capillary: 118 mg/dL — ABNORMAL HIGH (ref 65–99)

## 2017-01-06 LAB — CBC
HCT: 38.1 % — ABNORMAL LOW (ref 39.0–52.0)
HEMOGLOBIN: 11.9 g/dL — AB (ref 13.0–17.0)
MCH: 29.3 pg (ref 26.0–34.0)
MCHC: 31.2 g/dL (ref 30.0–36.0)
MCV: 93.8 fL (ref 78.0–100.0)
PLATELETS: 269 10*3/uL (ref 150–400)
RBC: 4.06 MIL/uL — ABNORMAL LOW (ref 4.22–5.81)
RDW: 14.3 % (ref 11.5–15.5)
WBC: 8.3 10*3/uL (ref 4.0–10.5)

## 2017-01-06 MED ORDER — METFORMIN HCL 500 MG PO TABS
1000.0000 mg | ORAL_TABLET | Freq: Two times a day (BID) | ORAL | Status: DC
  Administered 2017-01-06 – 2017-01-08 (×5): 1000 mg via ORAL
  Filled 2017-01-06 (×5): qty 2

## 2017-01-06 MED ORDER — KETOROLAC TROMETHAMINE 30 MG/ML IJ SOLN
30.0000 mg | Freq: Four times a day (QID) | INTRAMUSCULAR | Status: DC
  Administered 2017-01-06 – 2017-01-07 (×5): 30 mg via INTRAVENOUS
  Filled 2017-01-06 (×5): qty 1

## 2017-01-06 MED ORDER — HYDROMORPHONE HCL 1 MG/ML IJ SOLN
0.5000 mg | INTRAMUSCULAR | Status: DC | PRN
  Administered 2017-01-07: 0.5 mg via INTRAVENOUS
  Filled 2017-01-06 (×2): qty 0.5

## 2017-01-06 NOTE — Progress Notes (Signed)
Subjective No acute events. Doing well. BM x2 - soft, brown. Tolerating liquids without nausea/vomiting. Ambulating around unit. Using IS 10x/hr. Pain controlled on PCA.  Objective: Vital signs in last 24 hours: Temp:  [98 F (36.7 C)-98.6 F (37 C)] 98.3 F (36.8 C) (11/28 0035) Pulse Rate:  [57-68] 68 (11/28 0035) Resp:  [10-20] 10 (11/28 0536) BP: (104-113)/(53-61) 110/56 (11/28 0035) SpO2:  [94 %-99 %] 98 % (11/28 0536) Last BM Date: 01/05/17  Intake/Output from previous day: 11/27 0701 - 11/28 0700 In: 2447.5 [P.O.:600; I.V.:1847.5] Out: 5075 [Urine:5075] Intake/Output this shift: No intake/output data recorded.  Gen: NAD, comfortable CV: RRR Pulm: Normal work of breathing Abd: Soft, NT, nondistended; no r/g. Incisions c/d/i without erythema. Ext: SCDs in place  Lab Results: CBC  Recent Labs    01/05/17 0526 01/06/17 0516  WBC 12.0* 8.3  HGB 13.6 11.9*  HCT 42.4 38.1*  PLT 305 269   BMET Recent Labs    01/05/17 0526 01/06/17 0516  NA 135 138  K 4.6 4.0  CL 99* 102  CO2 28 32  GLUCOSE 152* 111*  BUN 12 8  CREATININE 1.23 0.87  CALCIUM 8.8* 8.6*   PT/INR No results for input(s): LABPROT, INR in the last 72 hours. ABG No results for input(s): PHART, HCO3 in the last 72 hours.  Invalid input(s): PCO2, PO2  Studies/Results:  Anti-infectives: Anti-infectives (From admission, onward)   Start     Dose/Rate Route Frequency Ordered Stop   01/04/17 0636  cefoTEtan in Dextrose 5% (CEFOTAN) 2-2.08 GM-%(50ML) IVPB    Comments:  Nadene RubinsCochran, Ron   : cabinet override      01/04/17 0636 01/04/17 0738   01/04/17 0547  cefoTEtan in Dextrose 5% (CEFOTAN) IVPB 2 g     2 g Intravenous On call to O.R. 01/04/17 0547 01/04/17 0738   01/04/17 0547  neomycin (MYCIFRADIN) tablet 1,000 mg  Status:  Discontinued     1,000 mg Oral 3 times per day 01/04/17 0547 01/04/17 1256   01/04/17 0547  metroNIDAZOLE (FLAGYL) tablet 1,000 mg  Status:  Discontinued     1,000 mg Oral 3  times per day 01/04/17 0547 01/04/17 1256       Assessment/Plan: Patient Active Problem List   Diagnosis Date Noted  . Intestinal diverticular abscess 11/24/2016  . Diverticulitis 11/24/2016  . Essential hypertension 11/24/2016  . Leukocytosis 11/24/2016  . HLD (hyperlipidemia) 11/24/2016  . Type 2 diabetes mellitus with complication, without long-term current use of insulin (HCC) 11/24/2016   s/p Procedure(s): LAPAROSCOPIC SIGMOIDECTOMY WITH rigid proctoscopy  and takedown of splenic flexure CYSTOSCOPY WITH BILATERAL URETERAL CATHETERS  PLACEMENT  POD#2  -Advance to GI soft diet -D/C PCA -D/C Foley -PO PPI for heartburn -Ambulate 5x/day; IS 10x/hr while awake -Ok to bathe and get soap/water over incisions  -PPx: SQH, SCDs, PPI -Dispo: Pending toleration of diet, pain control on oral analgesics, spontaneous voiding. Possible D/C tomorrow if all criteria satisfied  LOS: 2 days   Stephanie Couphristopher M. Cliffton AstersWhite, M.D. General and Colorectal Surgery [facility]

## 2017-01-07 LAB — CBC
HCT: 34.5 % — ABNORMAL LOW (ref 39.0–52.0)
Hemoglobin: 10.8 g/dL — ABNORMAL LOW (ref 13.0–17.0)
MCH: 29.4 pg (ref 26.0–34.0)
MCHC: 31.3 g/dL (ref 30.0–36.0)
MCV: 94 fL (ref 78.0–100.0)
Platelets: 227 10*3/uL (ref 150–400)
RBC: 3.67 MIL/uL — AB (ref 4.22–5.81)
RDW: 14.3 % (ref 11.5–15.5)
WBC: 8.9 10*3/uL (ref 4.0–10.5)

## 2017-01-07 LAB — GLUCOSE, CAPILLARY
GLUCOSE-CAPILLARY: 91 mg/dL (ref 65–99)
GLUCOSE-CAPILLARY: 108 mg/dL — AB (ref 65–99)
Glucose-Capillary: 100 mg/dL — ABNORMAL HIGH (ref 65–99)
Glucose-Capillary: 107 mg/dL — ABNORMAL HIGH (ref 65–99)

## 2017-01-07 LAB — BASIC METABOLIC PANEL
Anion gap: 6 (ref 5–15)
BUN: 15 mg/dL (ref 6–20)
CALCIUM: 8.4 mg/dL — AB (ref 8.9–10.3)
CO2: 28 mmol/L (ref 22–32)
CREATININE: 1.18 mg/dL (ref 0.61–1.24)
Chloride: 103 mmol/L (ref 101–111)
GFR calc non Af Amer: >60 mL/min (ref >60)
Glucose, Bld: 111 mg/dL — ABNORMAL HIGH (ref 65–99)
Potassium: 4 mmol/L (ref 3.5–5.1)
SODIUM: 137 mmol/L (ref 135–145)

## 2017-01-07 MED ORDER — HYDROCODONE-ACETAMINOPHEN 10-325 MG PO TABS: 1.0000 | ORAL_TABLET | Freq: Four times a day (QID) | ORAL | 0 refills | Status: AC | PRN

## 2017-01-07 NOTE — Progress Notes (Signed)
Subjective No acute events. Doing well. BM x1- soft, brown. Tolerating soft diet. Ambulating. Using IS 10x/hr. Pain issues yesterday - soreness but better once he got some IV breakthrough.  Objective: Vital signs in last 24 hours: Temp:  [98.1 F (36.7 C)-99.5 F (37.5 C)] 98.1 F (36.7 C) (11/29 0617) Pulse Rate:  [64-71] 64 (11/29 0617) Resp:  [17-20] 20 (11/29 0617) BP: (104-135)/(46-71) 110/60 (11/29 0617) SpO2:  [97 %-98 %] 97 % (11/29 0617) Last BM Date: 01/05/17  Intake/Output from previous day: 11/28 0701 - 11/29 0700 In: 710 [P.O.:710] Out: 1850 [Urine:1850] Intake/Output this shift: No intake/output data recorded.  Gen: NAD, comfortable CV: RRR Pulm: Normal work of breathing Abd: Soft, NT, nondistended; no r/g. Incisions c/d/i without erythema. Ext: SCDs in place  Lab Results: CBC  Recent Labs    01/06/17 0516 01/07/17 0545  WBC 8.3 8.9  HGB 11.9* 10.8*  HCT 38.1* 34.5*  PLT 269 227   BMET Recent Labs    01/06/17 0516 01/07/17 0545  NA 138 137  K 4.0 4.0  CL 102 103  CO2 32 28  GLUCOSE 111* 111*  BUN 8 15  CREATININE 0.87 1.18  CALCIUM 8.6* 8.4*   PT/INR No results for input(s): LABPROT, INR in the last 72 hours. ABG No results for input(s): PHART, HCO3 in the last 72 hours.  Invalid input(s): PCO2, PO2  Studies/Results:  Anti-infectives: Anti-infectives (From admission, onward)   Start     Dose/Rate Route Frequency Ordered Stop   01/04/17 0636  cefoTEtan in Dextrose 5% (CEFOTAN) 2-2.08 GM-%(50ML) IVPB    Comments:  Nadene RubinsCochran, Ron   : cabinet override      01/04/17 0636 01/04/17 0738   01/04/17 0547  cefoTEtan in Dextrose 5% (CEFOTAN) IVPB 2 g     2 g Intravenous On call to O.R. 01/04/17 0547 01/04/17 0738   01/04/17 0547  neomycin (MYCIFRADIN) tablet 1,000 mg  Status:  Discontinued     1,000 mg Oral 3 times per day 01/04/17 0547 01/04/17 1256   01/04/17 0547  metroNIDAZOLE (FLAGYL) tablet 1,000 mg  Status:  Discontinued     1,000 mg  Oral 3 times per day 01/04/17 0547 01/04/17 1256       Assessment/Plan: Patient Active Problem List   Diagnosis Date Noted  . Intestinal diverticular abscess 11/24/2016  . Diverticulitis 11/24/2016  . Essential hypertension 11/24/2016  . Leukocytosis 11/24/2016  . HLD (hyperlipidemia) 11/24/2016  . Type 2 diabetes mellitus with complication, without long-term current use of insulin (HCC) 11/24/2016   s/p Procedure(s): LAPAROSCOPIC SIGMOIDECTOMY WITH rigid proctoscopy  and takedown of splenic flexure CYSTOSCOPY WITH BILATERAL URETERAL CATHETERS  PLACEMENT  POD#3  -GI soft diet -Trend hgb as it was 10.8<--11.9<--13.6; once stabilizes, anticipate he will be ready for discharge home -Will hold toradol as cr 1.2 from 0.8 -PO PPI for heartburn -Ambulate 5x/day; IS 10x/hr while awake -Ok to bathe and get soap/water over incisions  -PPx: SQH, SCDs, PPI  LOS: 3 days   Stephanie Couphristopher M. Cliffton AstersWhite, M.D. General and Colorectal Surgery [facility]

## 2017-01-08 LAB — CBC
HCT: 35.2 % — ABNORMAL LOW (ref 39.0–52.0)
HEMOGLOBIN: 11.1 g/dL — AB (ref 13.0–17.0)
MCH: 29.1 pg (ref 26.0–34.0)
MCHC: 31.5 g/dL (ref 30.0–36.0)
MCV: 92.4 fL (ref 78.0–100.0)
Platelets: 244 10*3/uL (ref 150–400)
RBC: 3.81 MIL/uL — AB (ref 4.22–5.81)
RDW: 13.9 % (ref 11.5–15.5)
WBC: 7.7 10*3/uL (ref 4.0–10.5)

## 2017-01-08 LAB — GLUCOSE, CAPILLARY
GLUCOSE-CAPILLARY: 99 mg/dL (ref 65–99)
GLUCOSE-CAPILLARY: 115 mg/dL — AB (ref 65–99)

## 2017-01-08 LAB — BASIC METABOLIC PANEL
ANION GAP: 7 (ref 5–15)
BUN: 11 mg/dL (ref 6–20)
CALCIUM: 8.6 mg/dL — AB (ref 8.9–10.3)
CO2: 26 mmol/L (ref 22–32)
Chloride: 105 mmol/L (ref 101–111)
Creatinine, Ser: 1.09 mg/dL (ref 0.61–1.24)
Glucose, Bld: 109 mg/dL — ABNORMAL HIGH (ref 65–99)
POTASSIUM: 4 mmol/L (ref 3.5–5.1)
Sodium: 138 mmol/L (ref 135–145)

## 2017-01-08 NOTE — Discharge Summary (Signed)
Patient ID: Matthew ShileyDavid Reid MRN: [id number] DOB/AGE: Nov 26, 1960 56 y.o.  Admit date: 01/04/2017 Discharge date: 01/08/2017  Discharge Diagnoses Patient Active Problem List   Diagnosis Date Noted  . Intestinal diverticular abscess 11/24/2016  . Diverticulitis 11/24/2016  . Essential hypertension 11/24/2016  . Leukocytosis 11/24/2016  . HLD (hyperlipidemia) 11/24/2016  . Type 2 diabetes mellitus with complication, without long-term current use of insulin (HCC) 11/24/2016    Consultants None  Procedures Laparoscopic sigmoid colectomy with cystoscopy/stents (by urology)  [facility]  [facility] Course: Patient was taken to the OR on 01/04/17 and underwent the above procedures for hx of diverticulitis. On POD#4, the patient was having bowel movements, tolerating a regular diet and pain was controlled on oral analgesics. The patient was deemed stable for discharge home.  Vitals:   01/07/17 0617 01/07/17 1416 01/07/17 2136 01/08/17 0525  BP: 110/60 117/78 (!) 156/81 129/62  Pulse: 64 61 68 69  Resp: 20 18 18 18   Temp: 98.1 F (36.7 C) 98.6 F (37 C) 98.5 F (36.9 C) 98.6 F (37 C)  TempSrc: Oral Oral Oral Oral  SpO2: 97% 98% 98% 95%  Weight:      Height:       NAD, comfortable RRR Abdomen soft, NT/ND; incisions c/d/i without erythema Ext: SCDs in place without swelling/edema   Allergies as of 01/08/2017      Reactions   Glipizide Other (See Comments)   Made the patient feel "lousy"      Medication List    TAKE these medications   acetaminophen 500 MG tablet Commonly known as:  TYLENOL Take 500 mg every 4 (four) hours as needed by mouth for mild pain.   albuterol 108 (90 Base) MCG/ACT inhaler Commonly known as:  PROVENTIL HFA;VENTOLIN HFA Inhale 2 puffs every 6 (six) hours as needed into the lungs for wheezing or shortness of breath.   aspirin EC 81 MG tablet Take 81 mg every evening by mouth.   atorvastatin 20 MG tablet Commonly known as:  LIPITOR Take  20 mg by mouth daily.   cefpodoxime 200 MG tablet Commonly known as:  VANTIN Take 2 tablets (400 mg total) by mouth 2 (two) times daily.   HYDROcodone-acetaminophen 10-325 MG tablet Commonly known as:  NORCO Take 1 tablet by mouth every 6 (six) hours as needed (pain).   metFORMIN 1000 MG tablet Commonly known as:  GLUCOPHAGE Take 1,000 mg by mouth 2 (two) times daily.   metoprolol succinate 50 MG 24 hr tablet Commonly known as:  TOPROL-XL Take 50 mg every evening by mouth.   metroNIDAZOLE 500 MG tablet Commonly known as:  FLAGYL Take 1 tablet (500 mg total) by mouth 3 (three) times daily.   sodium chloride flush 0.9 % Soln Commonly known as:  NS Flush the abdominal drain with 10 ml every 8 hours.          Stephanie Couphristopher M. Cliffton AstersWhite, M.D. [facility]

## 2017-01-08 NOTE — Discharge Instructions (Signed)
POST OP INSTRUCTIONS  DIET: As tolerated. Be sure to include lots of fluids daily. Avoid raw/uncooked fruits and vegetables for the next couple of weeks. Otherwise, diet as tolerated.  1. Take your usually prescribed home medications unless otherwise directed.  2. PAIN CONTROL: a. Pain is best controlled by a usual combination of three different methods TOGETHER: i. Ice/Heat ii. Over the counter pain medication iii. Prescription pain medication b. Most patients will experience some swelling and bruising around the surgical site.  Ice packs or heating pads (30-60 minutes up to 6 times a day) will help. Some people prefer to use ice alone, heat alone, alternating between ice & heat.  Experiment to what works for you.  Swelling and bruising can take several weeks to resolve.   c. It is helpful to take an over-the-counter pain medication regularly for the first few weeks: i. Ibuprofen (Motrin/Advil) - 200mg  tabs - take 3 tabs (600mg ) every 6 hours as needed for pain ii. Acetaminophen (Tylenol) - you may take 650mg  every 6 hours as needed. You can take this with motrin as they act differently on the body. If you are taking a narcotic pain medication that has acetaminophen in it, do not take over the counter tylenol at the same time.  Iii. NOTE: You may take both of these medications together - most patients  find it most helpful when alternating between the two (i.e. Ibuprofen at 6am,  tylenol at 9am, ibuprofen at 12pm ...) d. A  prescription for pain medication should be given to you upon discharge.  Take your pain medication as prescribed if your pain is not adequatly controlled with the over-the-counter pain reliefs mentioned above.  3. Avoid getting constipated.  Between the surgery and the pain medications, it is common to experience some constipation.  Increasing fluid intake and taking a fiber supplement (such as Metamucil, Citrucel, FiberCon, MiraLax, etc) 1-2 times a day regularly will usually  help prevent this problem from occurring.  A mild laxative (prune juice, Milk of Magnesia, MiraLax, etc) can be taken according to package directions if there are no bowel movements after 48 hours.    4. Dressing: Your incision is covered in Dermabond which is like sterile superglue for the skin. This will come off on it's own in a couple weeks. It is waterproof and you may bathe normally. Avoid baths/pools/lakes/oceans until your wounds have fully healed.  5. ACTIVITIES as tolerated:   a. Avoid heavy lifting (>10lbs or 1 gallon of milk) for the next 6 weeks. b. You may resume regular (light) daily activities --such as daily self-care, walking, climbing stairs--gradually increasing activities as tolerated.  If you can walk 30 minutes without difficulty, it is safe to try more intense activity such as jogging, treadmill, bicycling, low-impact aerobics.  c. DO NOT PUSH THROUGH PAIN.  Let pain be your guide: If it hurts to do something, don't do it. d. Bonita QuinYou may drive when you are no longer taking prescription pain medication, you can comfortably wear a seatbelt, and you can safely maneuver your car and apply brakes. e. Bonita QuinYou may have sexual intercourse when it is comfortable.   6. FOLLOW UP in our office a. Please call [facility] at [phone] to set up an appointment to see your surgeon in the office for a follow-up appointment approximately 2 weeks after your surgery. b. Make sure that you call for this appointment the day you arrive home to insure a convenient appointment time.  9. If you have disability  or family leave forms that need to be completed, you may have them completed by your primary care physician's office; for return to work instructions, please ask our office staff and they will be happy to assist you in obtaining this documentation   When to call us [phone]: 1. Poor pain control 2. Reactions / problems with new medications (rash/itching, etc)  3. Fever over 101.5 F (38.5  C) 4. Inability to urinate 5. Nausea/vomiting 6. Worsening swelling or bruising 7. Continued bleeding from incision. 8. Increased pain, redness, or drainage from the incision  The clinic staff is available to answer your questions during regular business hours (8:30am-5pm).  Please dont hesitate to call and ask to speak to one of our nurses for clinical concerns.   A surgeon from [facility] is always on call at the hospitals   If you have a medical emergency, go to the nearest emergency room or call 911.  [facility] [address], [address], [place], [place]  [address] MAIN: [phone]) [phone] FAX: [phone]) [phone] [email or web address]

## 2017-01-25 DIAGNOSIS — M94261 Chondromalacia, right knee: Secondary | ICD-10-CM | POA: Diagnosis not present

## 2017-01-25 DIAGNOSIS — M23203 Derangement of unspecified medial meniscus due to old tear or injury, right knee: Secondary | ICD-10-CM | POA: Diagnosis not present

## 2017-03-23 DIAGNOSIS — E785 Hyperlipidemia, unspecified: Secondary | ICD-10-CM | POA: Diagnosis not present

## 2017-03-23 DIAGNOSIS — E119 Type 2 diabetes mellitus without complications: Secondary | ICD-10-CM | POA: Diagnosis not present

## 2017-03-23 DIAGNOSIS — Z6837 Body mass index (BMI) 37.0-37.9, adult: Secondary | ICD-10-CM | POA: Diagnosis not present

## 2017-03-23 DIAGNOSIS — Z87891 Personal history of nicotine dependence: Secondary | ICD-10-CM | POA: Diagnosis not present

## 2017-03-23 DIAGNOSIS — M23221 Derangement of posterior horn of medial meniscus due to old tear or injury, right knee: Secondary | ICD-10-CM | POA: Diagnosis not present

## 2017-03-23 DIAGNOSIS — Z7982 Long term (current) use of aspirin: Secondary | ICD-10-CM | POA: Diagnosis not present

## 2017-03-23 DIAGNOSIS — I10 Essential (primary) hypertension: Secondary | ICD-10-CM | POA: Diagnosis not present

## 2017-03-23 DIAGNOSIS — E669 Obesity, unspecified: Secondary | ICD-10-CM | POA: Diagnosis not present

## 2017-03-23 DIAGNOSIS — M94261 Chondromalacia, right knee: Secondary | ICD-10-CM | POA: Diagnosis not present

## 2017-03-23 DIAGNOSIS — M2241 Chondromalacia patellae, right knee: Secondary | ICD-10-CM | POA: Diagnosis not present

## 2017-03-23 DIAGNOSIS — Z7984 Long term (current) use of oral hypoglycemic drugs: Secondary | ICD-10-CM | POA: Diagnosis not present

## 2017-03-23 DIAGNOSIS — M1990 Unspecified osteoarthritis, unspecified site: Secondary | ICD-10-CM | POA: Diagnosis not present

## 2017-03-23 DIAGNOSIS — M23321 Other meniscus derangements, posterior horn of medial meniscus, right knee: Secondary | ICD-10-CM | POA: Diagnosis not present

## 2017-03-23 DIAGNOSIS — S83241A Other tear of medial meniscus, current injury, right knee, initial encounter: Secondary | ICD-10-CM | POA: Diagnosis not present

## 2017-03-29 DIAGNOSIS — J069 Acute upper respiratory infection, unspecified: Secondary | ICD-10-CM | POA: Diagnosis not present

## 2017-04-26 DIAGNOSIS — E119 Type 2 diabetes mellitus without complications: Secondary | ICD-10-CM | POA: Diagnosis not present

## 2017-04-26 DIAGNOSIS — E785 Hyperlipidemia, unspecified: Secondary | ICD-10-CM | POA: Diagnosis not present

## 2017-04-26 DIAGNOSIS — Z125 Encounter for screening for malignant neoplasm of prostate: Secondary | ICD-10-CM | POA: Diagnosis not present

## 2017-04-26 DIAGNOSIS — Z0000 Encounter for general adult medical examination without abnormal findings: Secondary | ICD-10-CM | POA: Diagnosis not present

## 2017-05-03 DIAGNOSIS — M25561 Pain in right knee: Secondary | ICD-10-CM | POA: Diagnosis not present

## 2017-05-03 DIAGNOSIS — Z9889 Other specified postprocedural states: Secondary | ICD-10-CM | POA: Diagnosis not present

## 2017-06-14 DIAGNOSIS — H04123 Dry eye syndrome of bilateral lacrimal glands: Secondary | ICD-10-CM | POA: Diagnosis not present

## 2017-06-14 DIAGNOSIS — H2513 Age-related nuclear cataract, bilateral: Secondary | ICD-10-CM | POA: Diagnosis not present

## 2017-06-14 DIAGNOSIS — Z7984 Long term (current) use of oral hypoglycemic drugs: Secondary | ICD-10-CM | POA: Diagnosis not present

## 2017-06-14 DIAGNOSIS — H524 Presbyopia: Secondary | ICD-10-CM | POA: Diagnosis not present

## 2017-06-14 DIAGNOSIS — H5213 Myopia, bilateral: Secondary | ICD-10-CM | POA: Diagnosis not present

## 2017-06-14 DIAGNOSIS — H52203 Unspecified astigmatism, bilateral: Secondary | ICD-10-CM | POA: Diagnosis not present

## 2017-06-14 DIAGNOSIS — M1711 Unilateral primary osteoarthritis, right knee: Secondary | ICD-10-CM | POA: Diagnosis not present

## 2017-06-14 DIAGNOSIS — E119 Type 2 diabetes mellitus without complications: Secondary | ICD-10-CM | POA: Diagnosis not present

## 2017-06-21 DIAGNOSIS — M1711 Unilateral primary osteoarthritis, right knee: Secondary | ICD-10-CM | POA: Diagnosis not present

## 2017-06-28 DIAGNOSIS — M1711 Unilateral primary osteoarthritis, right knee: Secondary | ICD-10-CM | POA: Diagnosis not present

## 2017-11-10 DIAGNOSIS — E1169 Type 2 diabetes mellitus with other specified complication: Secondary | ICD-10-CM | POA: Diagnosis not present

## 2017-11-10 DIAGNOSIS — Z23 Encounter for immunization: Secondary | ICD-10-CM | POA: Diagnosis not present

## 2017-11-10 DIAGNOSIS — I10 Essential (primary) hypertension: Secondary | ICD-10-CM | POA: Diagnosis not present

## 2017-11-10 DIAGNOSIS — E291 Testicular hypofunction: Secondary | ICD-10-CM | POA: Diagnosis not present

## 2017-11-10 DIAGNOSIS — E785 Hyperlipidemia, unspecified: Secondary | ICD-10-CM | POA: Diagnosis not present

## 2017-12-09 DIAGNOSIS — E291 Testicular hypofunction: Secondary | ICD-10-CM | POA: Diagnosis not present

## 2017-12-31 DIAGNOSIS — K5730 Diverticulosis of large intestine without perforation or abscess without bleeding: Secondary | ICD-10-CM | POA: Diagnosis not present

## 2017-12-31 DIAGNOSIS — Z86010 Personal history of colonic polyps: Secondary | ICD-10-CM | POA: Diagnosis not present

## 2017-12-31 DIAGNOSIS — D126 Benign neoplasm of colon, unspecified: Secondary | ICD-10-CM | POA: Diagnosis not present

## 2018-01-04 DIAGNOSIS — D126 Benign neoplasm of colon, unspecified: Secondary | ICD-10-CM | POA: Diagnosis not present

## 2018-01-04 DIAGNOSIS — K5730 Diverticulosis of large intestine without perforation or abscess without bleeding: Secondary | ICD-10-CM | POA: Diagnosis not present

## 2018-01-04 DIAGNOSIS — Z86010 Personal history of colonic polyps: Secondary | ICD-10-CM | POA: Diagnosis not present

## 2018-03-04 DIAGNOSIS — E291 Testicular hypofunction: Secondary | ICD-10-CM | POA: Diagnosis not present

## 2018-03-11 DIAGNOSIS — E291 Testicular hypofunction: Secondary | ICD-10-CM | POA: Diagnosis not present

## 2018-05-11 DIAGNOSIS — Z0000 Encounter for general adult medical examination without abnormal findings: Secondary | ICD-10-CM | POA: Diagnosis not present

## 2018-05-11 DIAGNOSIS — E785 Hyperlipidemia, unspecified: Secondary | ICD-10-CM | POA: Diagnosis not present

## 2018-05-11 DIAGNOSIS — Z79899 Other long term (current) drug therapy: Secondary | ICD-10-CM | POA: Diagnosis not present

## 2018-05-11 DIAGNOSIS — E119 Type 2 diabetes mellitus without complications: Secondary | ICD-10-CM | POA: Diagnosis not present

## 2018-05-11 DIAGNOSIS — I10 Essential (primary) hypertension: Secondary | ICD-10-CM | POA: Diagnosis not present

## 2018-05-11 DIAGNOSIS — R7301 Impaired fasting glucose: Secondary | ICD-10-CM | POA: Diagnosis not present

## 2018-07-07 DIAGNOSIS — E291 Testicular hypofunction: Secondary | ICD-10-CM | POA: Diagnosis not present

## 2018-07-13 DIAGNOSIS — E291 Testicular hypofunction: Secondary | ICD-10-CM | POA: Diagnosis not present

## 2018-07-13 DIAGNOSIS — N5201 Erectile dysfunction due to arterial insufficiency: Secondary | ICD-10-CM | POA: Diagnosis not present

## 2018-07-21 DIAGNOSIS — Z7984 Long term (current) use of oral hypoglycemic drugs: Secondary | ICD-10-CM | POA: Diagnosis not present

## 2018-07-21 DIAGNOSIS — H40003 Preglaucoma, unspecified, bilateral: Secondary | ICD-10-CM | POA: Diagnosis not present

## 2018-07-21 DIAGNOSIS — H5213 Myopia, bilateral: Secondary | ICD-10-CM | POA: Diagnosis not present

## 2018-07-21 DIAGNOSIS — H52203 Unspecified astigmatism, bilateral: Secondary | ICD-10-CM | POA: Diagnosis not present

## 2018-07-21 DIAGNOSIS — H524 Presbyopia: Secondary | ICD-10-CM | POA: Diagnosis not present

## 2018-07-21 DIAGNOSIS — E119 Type 2 diabetes mellitus without complications: Secondary | ICD-10-CM | POA: Diagnosis not present

## 2018-07-21 DIAGNOSIS — H2513 Age-related nuclear cataract, bilateral: Secondary | ICD-10-CM | POA: Diagnosis not present

## 2018-08-04 DIAGNOSIS — L84 Corns and callosities: Secondary | ICD-10-CM | POA: Diagnosis not present

## 2018-08-04 DIAGNOSIS — E119 Type 2 diabetes mellitus without complications: Secondary | ICD-10-CM | POA: Diagnosis not present

## 2018-08-04 DIAGNOSIS — M7742 Metatarsalgia, left foot: Secondary | ICD-10-CM | POA: Diagnosis not present

## 2018-08-04 DIAGNOSIS — M216X1 Other acquired deformities of right foot: Secondary | ICD-10-CM | POA: Diagnosis not present

## 2018-08-04 DIAGNOSIS — M2041 Other hammer toe(s) (acquired), right foot: Secondary | ICD-10-CM | POA: Diagnosis not present

## 2018-10-12 DIAGNOSIS — E291 Testicular hypofunction: Secondary | ICD-10-CM | POA: Diagnosis not present

## 2018-10-12 DIAGNOSIS — Z125 Encounter for screening for malignant neoplasm of prostate: Secondary | ICD-10-CM | POA: Diagnosis not present

## 2018-10-13 DIAGNOSIS — E291 Testicular hypofunction: Secondary | ICD-10-CM | POA: Diagnosis not present

## 2018-10-13 DIAGNOSIS — I10 Essential (primary) hypertension: Secondary | ICD-10-CM | POA: Diagnosis not present

## 2018-10-13 DIAGNOSIS — E785 Hyperlipidemia, unspecified: Secondary | ICD-10-CM | POA: Diagnosis not present

## 2018-10-13 DIAGNOSIS — E1169 Type 2 diabetes mellitus with other specified complication: Secondary | ICD-10-CM | POA: Diagnosis not present

## 2018-10-19 DIAGNOSIS — E291 Testicular hypofunction: Secondary | ICD-10-CM | POA: Diagnosis not present

## 2018-11-07 DIAGNOSIS — Z23 Encounter for immunization: Secondary | ICD-10-CM | POA: Diagnosis not present

## 2018-11-10 DIAGNOSIS — E1169 Type 2 diabetes mellitus with other specified complication: Secondary | ICD-10-CM | POA: Diagnosis not present

## 2018-11-10 DIAGNOSIS — E785 Hyperlipidemia, unspecified: Secondary | ICD-10-CM | POA: Diagnosis not present

## 2018-12-29 DIAGNOSIS — E785 Hyperlipidemia, unspecified: Secondary | ICD-10-CM | POA: Diagnosis not present

## 2018-12-29 DIAGNOSIS — Z7984 Long term (current) use of oral hypoglycemic drugs: Secondary | ICD-10-CM | POA: Diagnosis not present

## 2018-12-29 DIAGNOSIS — E119 Type 2 diabetes mellitus without complications: Secondary | ICD-10-CM | POA: Diagnosis not present

## 2018-12-29 DIAGNOSIS — Z6841 Body Mass Index (BMI) 40.0 and over, adult: Secondary | ICD-10-CM | POA: Diagnosis not present

## 2019-01-11 DIAGNOSIS — E291 Testicular hypofunction: Secondary | ICD-10-CM | POA: Diagnosis not present

## 2019-01-11 DIAGNOSIS — Z125 Encounter for screening for malignant neoplasm of prostate: Secondary | ICD-10-CM | POA: Diagnosis not present

## 2019-01-18 DIAGNOSIS — E291 Testicular hypofunction: Secondary | ICD-10-CM | POA: Diagnosis not present

## 2019-01-20 DIAGNOSIS — H40003 Preglaucoma, unspecified, bilateral: Secondary | ICD-10-CM | POA: Diagnosis not present

## 2019-03-20 DIAGNOSIS — M7542 Impingement syndrome of left shoulder: Secondary | ICD-10-CM | POA: Diagnosis not present

## 2019-03-20 DIAGNOSIS — M25512 Pain in left shoulder: Secondary | ICD-10-CM | POA: Diagnosis not present

## 2019-05-29 DIAGNOSIS — E119 Type 2 diabetes mellitus without complications: Secondary | ICD-10-CM | POA: Diagnosis not present

## 2019-05-29 DIAGNOSIS — E785 Hyperlipidemia, unspecified: Secondary | ICD-10-CM | POA: Diagnosis not present

## 2019-05-29 DIAGNOSIS — Z6841 Body Mass Index (BMI) 40.0 and over, adult: Secondary | ICD-10-CM | POA: Diagnosis not present

## 2019-06-07 DIAGNOSIS — E785 Hyperlipidemia, unspecified: Secondary | ICD-10-CM | POA: Diagnosis not present

## 2019-06-07 DIAGNOSIS — Z0000 Encounter for general adult medical examination without abnormal findings: Secondary | ICD-10-CM | POA: Diagnosis not present

## 2019-06-07 DIAGNOSIS — Z23 Encounter for immunization: Secondary | ICD-10-CM | POA: Diagnosis not present

## 2019-06-07 DIAGNOSIS — E119 Type 2 diabetes mellitus without complications: Secondary | ICD-10-CM | POA: Diagnosis not present

## 2019-07-12 DIAGNOSIS — Z125 Encounter for screening for malignant neoplasm of prostate: Secondary | ICD-10-CM | POA: Diagnosis not present

## 2019-07-12 DIAGNOSIS — E291 Testicular hypofunction: Secondary | ICD-10-CM | POA: Diagnosis not present

## 2019-07-19 DIAGNOSIS — E291 Testicular hypofunction: Secondary | ICD-10-CM | POA: Diagnosis not present

## 2019-08-03 DIAGNOSIS — H5213 Myopia, bilateral: Secondary | ICD-10-CM | POA: Diagnosis not present

## 2019-08-03 DIAGNOSIS — H40003 Preglaucoma, unspecified, bilateral: Secondary | ICD-10-CM | POA: Diagnosis not present

## 2019-08-03 DIAGNOSIS — Z7984 Long term (current) use of oral hypoglycemic drugs: Secondary | ICD-10-CM | POA: Diagnosis not present

## 2019-08-03 DIAGNOSIS — H524 Presbyopia: Secondary | ICD-10-CM | POA: Diagnosis not present

## 2019-08-03 DIAGNOSIS — E119 Type 2 diabetes mellitus without complications: Secondary | ICD-10-CM | POA: Diagnosis not present

## 2019-08-03 DIAGNOSIS — H47393 Other disorders of optic disc, bilateral: Secondary | ICD-10-CM | POA: Diagnosis not present

## 2019-08-03 DIAGNOSIS — H52203 Unspecified astigmatism, bilateral: Secondary | ICD-10-CM | POA: Diagnosis not present

## 2019-08-22 DIAGNOSIS — Z23 Encounter for immunization: Secondary | ICD-10-CM | POA: Diagnosis not present

## 2019-10-30 DIAGNOSIS — Z6841 Body Mass Index (BMI) 40.0 and over, adult: Secondary | ICD-10-CM | POA: Diagnosis not present

## 2019-10-30 DIAGNOSIS — E785 Hyperlipidemia, unspecified: Secondary | ICD-10-CM | POA: Diagnosis not present

## 2019-10-30 DIAGNOSIS — E119 Type 2 diabetes mellitus without complications: Secondary | ICD-10-CM | POA: Diagnosis not present

## 2020-01-10 ENCOUNTER — Ambulatory Visit: Payer: Self-pay

## 2020-01-10 ENCOUNTER — Ambulatory Visit (INDEPENDENT_AMBULATORY_CARE_PROVIDER_SITE_OTHER): Payer: BC Managed Care – PPO | Admitting: Orthopedic Surgery

## 2020-01-10 DIAGNOSIS — M25512 Pain in left shoulder: Secondary | ICD-10-CM | POA: Diagnosis not present

## 2020-01-10 DIAGNOSIS — M7502 Adhesive capsulitis of left shoulder: Secondary | ICD-10-CM

## 2020-01-10 NOTE — Progress Notes (Signed)
Subjective: Patient is here for ultrasound-guided intra-articular left glenohumeral injection.  He has chronic pain with frozen shoulder.  Objective: Limited with overhead reach, and especially with behind the back reach.  Procedure: Ultrasound guided injection is preferred based studies that show increased duration, increased effect, greater accuracy, decreased procedural pain, increased response rate, and decreased cost with ultrasound guided versus blind injection.   Verbal informed consent obtained.  Time-out conducted.  Noted no overlying erythema, induration, or other signs of local infection. Ultrasound-guided left glenohumeral injection: After sterile prep with Betadine, injected 4 cc 1% lidocaine without epinephrine and 40 mg methylprednisolone using a 22-gauge spinal needle, passing the needle from posterior approach into the glenohumeral joint.  Injectate was seen filling the joint capsule.  He had improvement in pain and also with range of motion during the anesthetic phase.

## 2020-01-21 ENCOUNTER — Encounter: Payer: Self-pay | Admitting: Orthopedic Surgery

## 2020-01-21 NOTE — Progress Notes (Signed)
Office Visit Note   Patient: Matthew Reid           Date of Birth: [birth year]           MRN: [id number] Visit Date: 01/10/2020 Requested by: Farris Has, MD [address] [place],  [place] [address] PCP: Farris Has, MD  Subjective: Chief Complaint  Patient presents with  . Left Shoulder - Pain    HPI: Matthew Reid is a 59 y.o. male who presents to the office complaining of left shoulder pain.  Patient notes pain in the left shoulder for at least 1 year.  This is worsening any associated with decreased range of motion.  He has severe pain with sudden movements such as reaching out away from his body.  Denies any injury.  He is able to still play golf and fish but it does limit how often he can do these activities.  He has occasional radiation to the left elbow.  Pain is worse at night and wakes him up.  He is right-hand dominant.  Localizes pain to the lateral shoulder.  Rates the pain 1-3/10 except for severe pain for several seconds with certain movements.  He has no radiation of pain past the elbow.  He has not tried physical therapy.  Takes occasional ibuprofen.  He does complain of subjective weakness of the shoulder.  Denies any history of thyroid issues but does note he has diabetes with A1c of 7.92 months ago.  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  No history of shoulder surgery or neck surgery.  Denies any numbness/tingling..                ROS: All systems reviewed are negative as they relate to the chief complaint within the history of present illness.  Patient denies fevers or chills.  Assessment & Plan: Visit Diagnoses:  1. Left shoulder pain, unspecified chronicity   2. Adhesive capsulitis of left shoulder     Plan: Patient is a 59 year old male who presents complaining of left shoulder pain.  He has had left shoulder pain for about 1 year.  Pain is worsening without injury.  Radiographs taken of the left shoulder today  showed no significant degenerative changes or any acute changes such as fracture/dislocation.  Excellent rotator cuff strength Reid exam today but he does have fairly reduced range of motion of the left shoulder compared with the contralateral side.  Impression is left shoulder adhesive capsulitis.  Plan to start physical therapy to work Reid shoulder range of motion.  Patient referred to Dr. Prince Rome for left shoulder glenohumeral injection.ROM exercises also encouraged as pain allows.  Follow-up in 6 weeks for clinical recheck.  Follow-Up Instructions: No follow-ups Reid file.   Orders:  Orders Placed This Encounter  Procedures  . XR Shoulder Left  . US Guided Needle Placement - No Linked Charges   No orders of the defined types were placed in this encounter.     Procedures: No procedures performed   Clinical Data: No additional findings.  Objective: Vital Signs: There were no vitals taken for this visit.  Physical Exam:  Constitutional: Patient appears well-developed HEENT:  Head: Normocephalic Eyes:EOM are normal Neck: Normal range of motion Cardiovascular: Normal rate Pulmonary/chest: Effort normal Neurologic: Patient is alert Skin: Skin is warm Psychiatric: Patient has normal mood and affect  Ortho Exam: Ortho exam demonstrates left shoulder with 40 degrees external rotation, 70 degrees abduction, 130 degrees forward flexion with internal rotation  of the left shoulder to just above the belt.  This is compared with the right shoulder with 7 degrees external rotation, 95 degrees abduction, 175 degrees forward flexion with internal rotation behind the back to the mid thoracic spine.  Excellent rotator cuff strength of the left and right shoulders with no weakness with infraspinatus, supraspinatus, subscapularis.  5/5 motor strength of the bilateral grip strength, finger abduction, pronation/supination, bicep, tricep, deltoid.  No tenderness over the axial cervical spine.  Mild  tenderness over the bicipital groove.  No tenderness over the [facility] joint.  No crepitus noted with passive range of motion of the left shoulder.  Specialty Comments:  No specialty comments available.  Imaging: No results found.   PMFS History: Patient Active Problem List   Diagnosis Date Noted  . Intestinal diverticular abscess 11/24/2016  . Diverticulitis 11/24/2016  . Essential hypertension 11/24/2016  . Leukocytosis 11/24/2016  . HLD (hyperlipidemia) 11/24/2016  . Type 2 diabetes mellitus with complication, without long-term current use of insulin (HCC) 11/24/2016   Past Medical History:  Diagnosis Date  . Diverticulitis   . Dysrhythmia    "1990s; palpations; tested; turned out to be stress" (11/26/2016)  . High cholesterol   . Hypertension   . Type II diabetes mellitus (HCC)     Family History  Problem Relation Age of Onset  . Hypertension Father     Past Surgical History:  Procedure Laterality Date  . ABDOMINAL HERNIA REPAIR  ~ 1998  . CT GUIDE ABSCESS DRAINAGE  ([facility] HX)  11/24/2016   CT guided percutaneous catheter drainage of diverticular abscess/notes 11/24/2016  . CYSTOSCOPY WITH STENT PLACEMENT Bilateral 01/04/2017   Procedure: CYSTOSCOPY WITH BILATERAL URETERAL CATHETERS  PLACEMENT;  Surgeon: Crista Elliot, MD;  Location: [facility];  Service: Urology;  Laterality: Bilateral;  . HEEL SPUR SURGERY  2012   Bilateral  . HERNIA REPAIR    . IR RADIOLOGIST EVAL & MGMT  12/08/2016  . LAPAROSCOPIC SIGMOID COLECTOMY N/A 01/04/2017   Procedure: LAPAROSCOPIC SIGMOIDECTOMY WITH rigid proctoscopy  and takedown of splenic flexure;  Surgeon: Andria Meuse, MD;  Location: [facility];  Service: General;  Laterality: N/A;  ERAS PATHWAY  . NASAL SEPTUM SURGERY  1980s  . TENDON RECONSTRUCTION  1987  . TOE AMPUTATION  1984   L great toe  . TONSILLECTOMY    . VASECTOMY  2013   Social History   Occupational History  . Not Reid file  Tobacco Use  . Smoking status: Former  Smoker    Types: Cigarettes  . Smokeless tobacco: Former Neurosurgeon  . Tobacco comment: 11/26/2016 "stopped smoking in the mid 1980s; quit dipping in ~ 2012; can't get off the nicorette gum"  Vaping Use  . Vaping Use: Never used  Substance and Sexual Activity  . Alcohol use: Yes    Comment: rare  . Drug use: Yes    Types: Marijuana    Comment: tried a few times in the past "  . Sexual activity: Yes

## 2020-01-30 DIAGNOSIS — M7502 Adhesive capsulitis of left shoulder: Secondary | ICD-10-CM | POA: Diagnosis not present

## 2020-01-30 DIAGNOSIS — M25512 Pain in left shoulder: Secondary | ICD-10-CM | POA: Diagnosis not present

## 2020-02-07 DIAGNOSIS — M7502 Adhesive capsulitis of left shoulder: Secondary | ICD-10-CM | POA: Diagnosis not present

## 2020-02-07 DIAGNOSIS — M25512 Pain in left shoulder: Secondary | ICD-10-CM | POA: Diagnosis not present

## 2020-02-12 DIAGNOSIS — H04123 Dry eye syndrome of bilateral lacrimal glands: Secondary | ICD-10-CM | POA: Diagnosis not present

## 2020-02-12 DIAGNOSIS — H40003 Preglaucoma, unspecified, bilateral: Secondary | ICD-10-CM | POA: Diagnosis not present

## 2020-02-12 DIAGNOSIS — Z83511 Family history of glaucoma: Secondary | ICD-10-CM | POA: Diagnosis not present

## 2020-02-21 DIAGNOSIS — E291 Testicular hypofunction: Secondary | ICD-10-CM | POA: Diagnosis not present

## 2020-02-21 DIAGNOSIS — E1169 Type 2 diabetes mellitus with other specified complication: Secondary | ICD-10-CM | POA: Diagnosis not present

## 2020-02-21 DIAGNOSIS — E785 Hyperlipidemia, unspecified: Secondary | ICD-10-CM | POA: Diagnosis not present

## 2020-02-21 DIAGNOSIS — I10 Essential (primary) hypertension: Secondary | ICD-10-CM | POA: Diagnosis not present

## 2020-03-06 DIAGNOSIS — Z125 Encounter for screening for malignant neoplasm of prostate: Secondary | ICD-10-CM | POA: Diagnosis not present

## 2020-03-06 DIAGNOSIS — E291 Testicular hypofunction: Secondary | ICD-10-CM | POA: Diagnosis not present

## 2020-03-08 DIAGNOSIS — E119 Type 2 diabetes mellitus without complications: Secondary | ICD-10-CM | POA: Diagnosis not present

## 2020-03-08 DIAGNOSIS — Z6841 Body Mass Index (BMI) 40.0 and over, adult: Secondary | ICD-10-CM | POA: Diagnosis not present

## 2020-03-08 DIAGNOSIS — E785 Hyperlipidemia, unspecified: Secondary | ICD-10-CM | POA: Diagnosis not present

## 2020-03-13 DIAGNOSIS — E291 Testicular hypofunction: Secondary | ICD-10-CM | POA: Diagnosis not present

## 2020-06-10 DIAGNOSIS — E291 Testicular hypofunction: Secondary | ICD-10-CM | POA: Diagnosis not present

## 2020-06-24 DIAGNOSIS — E291 Testicular hypofunction: Secondary | ICD-10-CM | POA: Diagnosis not present

## 2020-07-02 DIAGNOSIS — E785 Hyperlipidemia, unspecified: Secondary | ICD-10-CM | POA: Diagnosis not present

## 2020-07-02 DIAGNOSIS — E119 Type 2 diabetes mellitus without complications: Secondary | ICD-10-CM | POA: Diagnosis not present

## 2020-07-02 DIAGNOSIS — E669 Obesity, unspecified: Secondary | ICD-10-CM | POA: Diagnosis not present

## 2020-07-02 DIAGNOSIS — Z7984 Long term (current) use of oral hypoglycemic drugs: Secondary | ICD-10-CM | POA: Diagnosis not present

## 2020-07-22 DIAGNOSIS — E1169 Type 2 diabetes mellitus with other specified complication: Secondary | ICD-10-CM | POA: Diagnosis not present

## 2020-07-22 DIAGNOSIS — Z0000 Encounter for general adult medical examination without abnormal findings: Secondary | ICD-10-CM | POA: Diagnosis not present

## 2020-07-22 DIAGNOSIS — E785 Hyperlipidemia, unspecified: Secondary | ICD-10-CM | POA: Diagnosis not present

## 2020-08-19 DIAGNOSIS — H52203 Unspecified astigmatism, bilateral: Secondary | ICD-10-CM | POA: Diagnosis not present

## 2020-08-19 DIAGNOSIS — H5213 Myopia, bilateral: Secondary | ICD-10-CM | POA: Diagnosis not present

## 2020-08-19 DIAGNOSIS — H40003 Preglaucoma, unspecified, bilateral: Secondary | ICD-10-CM | POA: Diagnosis not present

## 2020-08-19 DIAGNOSIS — H524 Presbyopia: Secondary | ICD-10-CM | POA: Diagnosis not present

## 2020-08-19 DIAGNOSIS — Z7984 Long term (current) use of oral hypoglycemic drugs: Secondary | ICD-10-CM | POA: Diagnosis not present

## 2020-08-19 DIAGNOSIS — E119 Type 2 diabetes mellitus without complications: Secondary | ICD-10-CM | POA: Diagnosis not present

## 2020-08-19 DIAGNOSIS — H04123 Dry eye syndrome of bilateral lacrimal glands: Secondary | ICD-10-CM | POA: Diagnosis not present

## 2020-09-26 DIAGNOSIS — E291 Testicular hypofunction: Secondary | ICD-10-CM | POA: Diagnosis not present

## 2020-09-26 DIAGNOSIS — Z125 Encounter for screening for malignant neoplasm of prostate: Secondary | ICD-10-CM | POA: Diagnosis not present

## 2020-10-03 DIAGNOSIS — D751 Secondary polycythemia: Secondary | ICD-10-CM | POA: Diagnosis not present

## 2020-10-03 DIAGNOSIS — E291 Testicular hypofunction: Secondary | ICD-10-CM | POA: Diagnosis not present
